# Patient Record
Sex: Female | Born: 1965 | Race: White | Hispanic: No | Marital: Married | Smoking: Former smoker
Health system: Southern US, Community
[De-identification: ages and names within clinical notes are randomized; demographics above are authoritative.]

## PROBLEM LIST (undated history)

## (undated) DIAGNOSIS — K219 Gastro-esophageal reflux disease without esophagitis: Secondary | ICD-10-CM

## (undated) DIAGNOSIS — M1990 Unspecified osteoarthritis, unspecified site: Secondary | ICD-10-CM

## (undated) DIAGNOSIS — I10 Essential (primary) hypertension: Secondary | ICD-10-CM

## (undated) HISTORY — PX: WISDOM TOOTH EXTRACTION: SHX21

## (undated) HISTORY — PX: KNEE ARTHROSCOPY: SUR90

## (undated) HISTORY — PX: COLONOSCOPY: SHX174

---

## 2012-05-18 HISTORY — PX: BACK SURGERY: SHX140

## 2013-04-10 ENCOUNTER — Other Ambulatory Visit: Payer: Self-pay | Admitting: Neurosurgery

## 2013-04-10 DIAGNOSIS — M549 Dorsalgia, unspecified: Secondary | ICD-10-CM

## 2013-04-14 ENCOUNTER — Ambulatory Visit
Admission: RE | Admit: 2013-04-14 | Discharge: 2013-04-14 | Disposition: A | Discharge status: Home / Self Care | Payer: BC Managed Care – PPO | Source: Ambulatory Visit | Attending: Neurosurgery | Admitting: Neurosurgery

## 2013-04-14 VITALS — BP 131/87 | HR 80

## 2013-04-14 DIAGNOSIS — M5126 Other intervertebral disc displacement, lumbar region: Secondary | ICD-10-CM

## 2013-04-14 DIAGNOSIS — M549 Dorsalgia, unspecified: Secondary | ICD-10-CM

## 2013-04-14 MED ORDER — METHYLPREDNISOLONE ACETATE 40 MG/ML INJ SUSP (RADIOLOG
120.0000 mg | Freq: Once | INTRAMUSCULAR | Status: AC
  Administered 2013-04-14: 120 mg via EPIDURAL

## 2013-04-14 MED ORDER — IOHEXOL 180 MG/ML  SOLN
1.0000 mL | Freq: Once | INTRAMUSCULAR | Status: AC | PRN
  Administered 2013-04-14: 1 mL via EPIDURAL

## 2018-09-07 ENCOUNTER — Encounter (HOSPITAL_COMMUNITY): Admission: RE | Payer: Self-pay | Source: Home / Self Care

## 2018-09-07 ENCOUNTER — Inpatient Hospital Stay (HOSPITAL_COMMUNITY): Admission: RE | Admit: 2018-09-07 | Payer: Self-pay | Source: Home / Self Care | Admitting: Orthopedic Surgery

## 2018-09-07 SURGERY — ARTHROPLASTY, KNEE, BILATERAL, TOTAL
Anesthesia: Choice | Laterality: Bilateral

## 2018-11-09 ENCOUNTER — Encounter (HOSPITAL_COMMUNITY): Payer: Self-pay | Admitting: Student

## 2018-11-09 NOTE — H&P (Signed)
TOTAL KNEE ADMISSION H&P  Patient is being admitted for bilateral total knee arthroplasty.  Subjective:  Chief Complaint:bilateral knee pain.  HPI: Joanna Barber, 53 y.o. female, has a history of pain and functional disability in the bilateral knee due to arthritis and has failed non-surgical conservative treatments for greater than 12 weeks to includecorticosteriod injections, viscosupplementation injections, weight reduction as appropriate and activity modification.  Onset of symptoms was gradual, starting >10 years ago with gradually worsening course since that time. The patient noted prior procedures on the knee to include  arthroscopy and menisectomy on the left knee(s), and previous arthroscopy and ACL reconstruction on the right knee.  Patient currently rates pain in the bilateral knee(s) at 8 out of 10 with activity. Patient has worsening of pain with activity and weight bearing, crepitus, joint swelling and instability.  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 She had a previous ACL reconstruction on the right. She has interference screws present in the tibia and femur by imaging studies. There is no active infection.  There are no active problems to display for this patient.  History reviewed. No pertinent past medical history.  History reviewed. No pertinent surgical history.  No current facility-administered medications for this encounter.    No current outpatient medications on file.   Allergies  Allergen Reactions  . Neosporin [Neomycin-Bacitracin Zn-Polymyx] Dermatitis    Eats up her skin   . Codeine Nausea And Vomiting    Social History   Tobacco Use  . Smoking status: Not on file  Substance Use Topics  . Alcohol use: Not on file    History reviewed. No pertinent family history.   Review of Systems  Constitutional: Negative for chills and fever.  HENT:  Negative for congestion, sore throat and tinnitus.   Eyes: Negative for double vision, photophobia and pain.  Respiratory: Negative for cough, shortness of breath and wheezing.   Cardiovascular: Negative for chest pain, palpitations and orthopnea.  Gastrointestinal: Negative for heartburn, nausea and vomiting.  Genitourinary: Negative for dysuria, frequency and urgency.  Musculoskeletal: Positive for joint pain.  Neurological: Negative for dizziness, weakness and headaches.    Objective:  Physical Exam  Well nourished and well developed.  General: Alert and oriented x3, cooperative and pleasant, no acute distress.  Head: normocephalic, atraumatic, neck supple.  Eyes: EOMI.  Respiratory: breath sounds clear in all fields, no wheezing, rales, or rhonchi. Cardiovascular: Regular rate and rhythm, no murmurs, gallops or rubs.  Abdomen: non-tender to palpation and soft, normoactive bowel sounds. Musculoskeletal:  Right Knee Exam:  Significant varus deformity. No effusion. No Swelling. Range of motion is 0-125 degrees.  No crepitus on range of motion of the knee.  Positive medial joint line tenderness No lateral joint line tenderness.  Significant A/P and varus/valgus laxity in the knee.   Left Knee Exam:  Significant varus deformity.  No effusion. No Swelling. Range of motion is 0-130 degrees.  No crepitus on range of motion of the knee.  Positive medial joint line tenderness No lateral joint line tenderness.  Valgus laxity is noted in terminal extension.  Significant A/P laxity in 90 degrees of flexion.   Calves soft and nontender. Motor function intact in LE. Strength 5/5 LE bilaterally. Neuro: Distal pulses 2+. Sensation to light touch intact in LE.  Vital signs in last 24 hours: Blood pressure: 162/80 mmHg Pulse: 84 bpm  Labs:   There is no  height or weight on file to calculate BMI.   Imaging Review Plain radiographs demonstrate severe degenerative joint disease  of the bilateral knee(s). The overall alignment isneutral. The bone quality appears to be adequate for age and reported activity level.      Assessment/Plan:  End stage arthritis, bilateral knees  The patient history, physical examination, clinical judgment of the provider and imaging studies are consistent with end stage degenerative joint disease of the bilateral knee(s) and total knee arthroplasty is deemed medically necessary. The treatment options including medical management, injection therapy arthroscopy and arthroplasty were discussed at length. The risks and benefits of total knee arthroplasty were presented and reviewed. The risks due to aseptic loosening, infection, stiffness, patella tracking problems, thromboembolic complications and other imponderables were discussed. The patient acknowledged the explanation, agreed to proceed with the plan and consent was signed. Patient is being admitted for inpatient treatment for surgery, pain control, PT, OT, prophylactic antibiotics, VTE prophylaxis, progressive ambulation and ADL's and discharge planning. The patient is planning to be discharged to inpatient rehab  Anticipated LOS equal to or greater than 2 midnights due to - Age 43 and older with one or more of the following:  - Obesity  - Expected need for hospital services (PT, OT, Nursing) required for safe  discharge  - Anticipated need for postoperative skilled nursing care or inpatient rehab  - Active co-morbidities: None OR   - Unanticipated findings during/Post Surgery: None  - Patient is a high risk of re-admission due to: None   Therapy Plans: [facility] then outpatient therapy at [facility] ([facility]) Disposition: Inpatient Rehab Planned DVT Prophylaxis: Xarelto 10 mg daily DME needed: Gilford Rile, 3-in-1 PCP: Doug Sou, DO TXA: IV Allergies: NKDA Anesthesia Concerns: None BMI: 33.7  - Patient was instructed on what medications to stop prior to surgery. -  Follow-up visit in 2 weeks with Dr. Wynelle Link - Begin physical therapy following surgery - Pre-operative lab work as pre-surgical testing - Prescriptions will be provided in hospital at time of discharge  Theresa Duty, PA-C Orthopedic Surgery [facility] [place]

## 2018-11-24 ENCOUNTER — Other Ambulatory Visit (HOSPITAL_COMMUNITY): Payer: Self-pay | Admitting: *Deleted

## 2018-11-24 NOTE — Patient Instructions (Addendum)
YOU HAD  A COVID 19 TEST ON 11-26-2018.  COME TO [place] ENTRANCE. ONCE YOUR COVID TEST IS COMPLETED, PLEASE BEGIN THE QUARANTINE INSTRUCTIONS AS OUTLINED IN YOUR HANDOUT.                Joanna Barber     Your procedure is scheduled on: 11-30-2018   Report to [place]  Entrance  Report to admitting at 7:55 AM      Call this number if you have problems the morning of surgery [phone]     Remember: [place], NO [place].   NO SOLID FOOD AFTER MIDNIGHT THE NIGHT PRIOR TO SURGERY.  NOTHING BY MOUTH EXCEPT CLEAR LIQUIDS UNTIL  7:25 AM.  PLEASE FINISH ENSURE DRINK PER SURGEON ORDER 3 HOURS PRIOR TO SCHEDULED SURGERY TIME WHICH NEEDS TO BE COMPLETED AT 7:25 AM.  CLEAR LIQUID DIET   Foods Allowed                                                                     Foods Excluded  Coffee and tea, regular and decaf                             liquids that you cannot  Plain Jell-O in any flavor                                             see through such as: Fruit ices (not with fruit pulp)                                     milk, soups, orange juice  Iced Popsicles                                    All solid food Carbonated beverages, regular and diet                                    Cranberry, grape and apple juices Sports drinks like Gatorade Lightly seasoned clear broth or consume(fat free) Sugar, honey syrup  Sample Menu Breakfast                                Lunch                                     Supper Cranberry juice                    Beef broth  Chicken broth Jell-O                                     Grape juice                           Apple juice Coffee or tea                        Jell-O                                      Popsicle                                                Coffee or tea                         Coffee or tea  _____________________________________________________________________      Take these medicines the morning of surgery with A SIP OF WATER: Prilosec                               You may not have any metal on your body including hair pins and              piercings              Do not wear jewelry, make-up, lotions, powders or perfumes, deodorant             Do not wear nail polish.  Do not shave  48 hours prior to surgery.               Do not bring valuables to the hospital.  IS NOT             RESPONSIBLE   FOR VALUABLES.  Contacts, dentures or bridgework may not be worn into surgery.     _____________________________________________________________________             [facility] Before surgery, you can play an important role .  Because skin is not sterile, your skin needs to be as free of germs as possible.   You can reduce the number of germs on your skin by washing with CHG (chlorahexidine gluconate) soap before surgery.   CHG is an antiseptic cleaner which kills germs and bonds with the skin to continue killing germs even after washing. Please DO NOT use if you have an allergy to CHG or antibacterial soaps.   If your skin becomes reddened/irritated stop using the CHG and inform your nurse when you arrive at Short Stay. Do not shave (including legs and underarms) for at least 48 hours prior to the first CHG shower.Please follow these instructions carefully:  1.  Shower with CHG Soap the night before surgery and the  morning of Surgery.  2.  If you choose to wash your hair, wash your hair first as usual with your  normal  shampoo.  3.  After you shampoo, rinse your hair and body thoroughly to remove the  shampoo.  4.  Use CHG as you would any other liquid soap.  You can apply chg directly  to the skin and wash                       Gently with a scrungie or clean washcloth.  5.  Apply  the CHG Soap to your body ONLY FROM THE NECK DOWN.   Do not use on face/ open                           Wound or open sores. Avoid contact with eyes, ears mouth and genitals (private parts).                       Wash face,  Genitals (private parts) with your normal soap.             6.  Wash thoroughly, paying special attention to the area where your surgery  will be performed.  7.  Thoroughly rinse your body with warm water from the neck down.  8.  DO NOT shower/wash with your normal soap after using and rinsing off  the CHG Soap.                9.  Pat yourself dry with a clean towel.            10.  Wear clean pajamas.            11.  Place clean sheets on your bed the night of your first shower and do not  sleep with pets. Day of Surgery : Do not apply any lotions/deodorants the morning of surgery.  Please wear clean clothes to the hospital/surgery center.  FAILURE TO FOLLOW THESE INSTRUCTIONS MAY RESULT IN THE CANCELLATION OF YOUR SURGERY PATIENT SIGNATURE_________________________________  NURSE SIGNATURE__________________________________  ________________________________________________________________________   Joanna Barber  An incentive spirometer is a tool that can help keep your lungs clear and active. This tool measures how well you are filling your lungs with each breath. Taking long deep breaths may help reverse or decrease the chance of developing breathing (pulmonary) problems (especially infection) following:  A long period of time when you are unable to move or be active. BEFORE THE PROCEDURE   If the spirometer includes an indicator to show your best effort, your nurse or respiratory therapist will set it to a desired goal.  If possible, sit up straight or lean slightly forward. Try not to slouch.  Hold the incentive spirometer in an upright position. INSTRUCTIONS FOR USE  1. Sit on the edge of your bed if possible, or sit up as far as you can in bed or on  a chair. 2. Hold the incentive spirometer in an upright position. 3. Breathe out normally. 4. Place the mouthpiece in your mouth and seal your lips tightly around it. 5. Breathe in slowly and as deeply as possible, raising the piston or the ball toward the top of the column. 6. Hold your breath for 3-5 seconds or for as long as possible. Allow the piston or ball to fall to the bottom of the column. 7. Remove the mouthpiece from your mouth and breathe out normally. 8. Rest for a few seconds and repeat Steps 1 through 7 at least 10 times every 1-2 hours when you are awake. Take your time and take a few normal breaths between deep breaths. 9. The spirometer may include an indicator to  show your best effort. Use the indicator as a goal to work toward during each repetition. 10. After each set of 10 deep breaths, practice coughing to be sure your lungs are clear. If you have an incision (the cut made at the time of surgery), support your incision when coughing by placing a pillow or rolled up towels firmly against it. Once you are able to get out of bed, walk around indoors and cough well. You may stop using the incentive spirometer when instructed by your caregiver.  RISKS AND COMPLICATIONS  Take your time so you do not get dizzy or light-headed.  If you are in pain, you may need to take or ask for pain medication before doing incentive spirometry. It is harder to take a deep breath if you are having pain. AFTER USE  Rest and breathe slowly and easily.  It can be helpful to keep track of a log of your progress. Your caregiver can provide you with a simple table to help with this. If you are using the spirometer at home, follow these instructions: [place] IF:   You are having difficultly using the spirometer.  You have trouble using the spirometer as often as instructed.  Your pain medication is not giving enough relief while using the spirometer.  You develop fever of 100.5 F  (38.1 C) or higher. SEEK IMMEDIATE MEDICAL CARE IF:   You cough up bloody sputum that had not been present before.  You develop fever of 102 F (38.9 C) or greater.  You develop worsening pain at or near the incision site. MAKE SURE YOU:   Understand these instructions.  Will watch your condition.  Will get help right away if you are not doing well or get worse. Document Released: 09/14/2006 Document Revised: 07/27/2011 Document Reviewed: 11/15/2006 ExitCare Patient Information 2014 ExitCare, [place].   ________________________________________________________________________  WHAT IS A BLOOD TRANSFUSION? Blood Transfusion Information  A transfusion is the replacement of blood or some of its parts. Blood is made up of multiple cells which provide different functions.  Red blood cells carry oxygen and are used for blood loss replacement.  White blood cells fight against infection.  Platelets control bleeding.  Plasma helps clot blood.  Other blood products are available for specialized needs, such as hemophilia or other clotting disorders. BEFORE THE TRANSFUSION  Who gives blood for transfusions?   Healthy volunteers who are fully evaluated to make sure their blood is safe. This is blood bank blood. Transfusion therapy is the safest it has ever been in the practice of medicine. Before blood is taken from a donor, a complete history is taken to make sure that person has no history of diseases nor engages in risky social behavior (examples are intravenous drug use or sexual activity with multiple partners). The donor's travel history is screened to minimize risk of transmitting infections, such as malaria. The donated blood is tested for signs of infectious diseases, such as HIV and hepatitis. The blood is then tested to be sure it is compatible with you in order to minimize the chance of a transfusion reaction. If you or a relative donates blood, this is often done in anticipation  of surgery and is not appropriate for emergency situations. It takes many days to process the donated blood. RISKS AND COMPLICATIONS Although transfusion therapy is very safe and saves many lives, the main dangers of transfusion include:   Getting an infectious disease.  Developing a transfusion reaction. This is an allergic reaction  to something in the blood you were given. Every precaution is taken to prevent this. The decision to have a blood transfusion has been considered carefully by your caregiver before blood is given. Blood is not given unless the benefits outweigh the risks. AFTER THE TRANSFUSION  Right after receiving a blood transfusion, you will usually feel much better and more energetic. This is especially true if your red blood cells have gotten low (anemic). The transfusion raises the level of the red blood cells which carry oxygen, and this usually causes an energy increase.  The nurse administering the transfusion will monitor you carefully for complications. HOME CARE INSTRUCTIONS  No special instructions are needed after a transfusion. You may find your energy is better. Speak with your caregiver about any limitations on activity for underlying diseases you may have. SEEK MEDICAL CARE IF:   Your condition is not improving after your transfusion.  You develop redness or irritation at the intravenous (IV) site. SEEK IMMEDIATE MEDICAL CARE IF:  Any of the following symptoms occur over the next 12 hours:  Shaking chills.  You have a temperature by mouth above 102 F (38.9 C), not controlled by medicine.  Chest, back, or muscle pain.  People around you feel you are not acting correctly or are confused.  Shortness of breath or difficulty breathing.  Dizziness and fainting.  You get a rash or develop hives.  You have a decrease in urine output.  Your urine turns a dark color or changes to pink, red, or brown. Any of the following symptoms occur over the next 10  days:  You have a temperature by mouth above 102 F (38.9 C), not controlled by medicine.  Shortness of breath.  Weakness after normal activity.  The white part of the eye turns yellow (jaundice).  You have a decrease in the amount of urine or are urinating less often.  Your urine turns a dark color or changes to pink, red, or brown. Document Released: 05/01/2000 Document Revised: 07/27/2011 Document Reviewed: 12/19/2007 [facility] Patient Information 2014 [place], [place].  _______________________________________________________________________[id number]

## 2018-11-26 ENCOUNTER — Other Ambulatory Visit (HOSPITAL_COMMUNITY)
Admission: RE | Admit: 2018-11-26 | Discharge: 2018-11-26 | Disposition: A | Discharge status: Home / Self Care | Payer: 59 | Source: Ambulatory Visit | Attending: Orthopedic Surgery | Admitting: Orthopedic Surgery

## 2018-11-26 DIAGNOSIS — Z1159 Encounter for screening for other viral diseases: Secondary | ICD-10-CM | POA: Diagnosis not present

## 2018-11-26 DIAGNOSIS — Z01812 Encounter for preprocedural laboratory examination: Secondary | ICD-10-CM | POA: Diagnosis not present

## 2018-11-26 LAB — SARS CORONAVIRUS 2 (TAT 6-24 HRS): SARS Coronavirus 2: NEGATIVE

## 2018-11-28 ENCOUNTER — Encounter (HOSPITAL_COMMUNITY): Payer: Self-pay

## 2018-11-28 ENCOUNTER — Other Ambulatory Visit: Payer: Self-pay

## 2018-11-28 ENCOUNTER — Encounter (HOSPITAL_COMMUNITY)
Admission: RE | Admit: 2018-11-28 | Discharge: 2018-11-28 | Disposition: A | Discharge status: Home / Self Care | Payer: 59 | Source: Ambulatory Visit | Attending: Orthopedic Surgery | Admitting: Orthopedic Surgery

## 2018-11-28 DIAGNOSIS — Z01818 Encounter for other preprocedural examination: Secondary | ICD-10-CM | POA: Insufficient documentation

## 2018-11-28 DIAGNOSIS — M170 Bilateral primary osteoarthritis of knee: Secondary | ICD-10-CM | POA: Insufficient documentation

## 2018-11-28 DIAGNOSIS — I10 Essential (primary) hypertension: Secondary | ICD-10-CM | POA: Insufficient documentation

## 2018-11-28 HISTORY — DX: Unspecified osteoarthritis, unspecified site: M19.90

## 2018-11-28 HISTORY — DX: Essential (primary) hypertension: I10

## 2018-11-28 LAB — COMPREHENSIVE METABOLIC PANEL
ALT: 39 U/L (ref 0–44)
AST: 33 U/L (ref 15–41)
Albumin: 4.3 g/dL (ref 3.5–5.0)
Alkaline Phosphatase: 84 U/L (ref 38–126)
Anion gap: 14 (ref 5–15)
BUN: 18 mg/dL (ref 6–20)
CO2: 25 mmol/L (ref 22–32)
Calcium: 9.6 mg/dL (ref 8.9–10.3)
Chloride: 100 mmol/L (ref 98–111)
Creatinine, Ser: 0.65 mg/dL (ref 0.44–1.00)
GFR calc Af Amer: >60 mL/min (ref >60)
GFR calc non Af Amer: >60 mL/min (ref >60)
Glucose, Bld: 114 mg/dL — ABNORMAL HIGH (ref 70–99)
Potassium: 3.9 mmol/L (ref 3.5–5.1)
Sodium: 139 mmol/L (ref 135–145)
Total Bilirubin: 0.6 mg/dL (ref 0.3–1.2)
Total Protein: 7.8 g/dL (ref 6.5–8.1)

## 2018-11-28 LAB — SURGICAL PCR SCREEN
MRSA, PCR: NEGATIVE
Staphylococcus aureus: NEGATIVE

## 2018-11-28 LAB — PROTIME-INR
INR: 1 (ref 0.8–1.2)
Prothrombin Time: 12.9 seconds (ref 11.4–15.2)

## 2018-11-28 LAB — CBC
HCT: 38.5 % (ref 36.0–46.0)
Hemoglobin: 12.4 g/dL (ref 12.0–15.0)
MCH: 32.5 pg (ref 26.0–34.0)
MCHC: 32.2 g/dL (ref 30.0–36.0)
MCV: 100.8 fL — ABNORMAL HIGH (ref 80.0–100.0)
Platelets: 264 10*3/uL (ref 150–400)
RBC: 3.82 MIL/uL — ABNORMAL LOW (ref 3.87–5.11)
RDW: 12.5 % (ref 11.5–15.5)
WBC: 6.5 10*3/uL (ref 4.0–10.5)
nRBC: 0 % (ref 0.0–0.2)

## 2018-11-28 LAB — APTT: aPTT: 31 seconds (ref 24–36)

## 2018-11-28 LAB — ABO/RH: ABO/RH(D): O POS

## 2018-11-29 NOTE — Progress Notes (Signed)
Spoke with patient by phone, patient left ensure and hibiclens on shuttle yesterday and does not have those items . Patient to be npo after midnight clear liquids until 725 am then npo, will not do ensure drink and will go to pharmacy and get hibiclens, will take prilosec am of surgery ariive 755am 11-30-18 [facility] admitting.

## 2018-11-29 NOTE — Anesthesia Preprocedure Evaluation (Addendum)
Anesthesia Evaluation  Patient identified by MRN, date of birth, ID band Patient awake    Reviewed: Allergy & Precautions, H&P , NPO status , Patient's Chart, lab work & pertinent test results  Airway Mallampati: II  TM Distance: >3 FB Neck ROM: Full    Dental no notable dental hx. (+) Teeth Intact, Dental Advisory Given   Pulmonary neg pulmonary ROS, former smoker,    Pulmonary exam normal breath sounds clear to auscultation       Cardiovascular Exercise Tolerance: Good hypertension, Pt. on medications  Rhythm:Regular Rate:Normal     Neuro/Psych negative neurological ROS  negative psych ROS   GI/Hepatic negative GI ROS, Neg liver ROS,   Endo/Other  negative endocrine ROS  Renal/GU negative Renal ROS  negative genitourinary   Musculoskeletal  (+) Arthritis , Osteoarthritis,    Abdominal   Peds  Hematology negative hematology ROS (+)   Anesthesia Other Findings   Reproductive/Obstetrics negative OB ROS                            Anesthesia Physical Anesthesia Plan  ASA: II  Anesthesia Plan: Epidural and MAC   Post-op Pain Management:    Induction: Intravenous  PONV Risk Score and Plan: 2 and Propofol infusion, Ondansetron and Midazolam  Airway Management Planned: Simple Face Mask  Additional Equipment:   Intra-op Plan:   Post-operative Plan:   Informed Consent: I have reviewed the patients History and Physical, chart, labs and discussed the procedure including the risks, benefits and alternatives for the proposed anesthesia with the patient or authorized representative who has indicated his/her understanding and acceptance.     Dental advisory given  Plan Discussed with: CRNA  Anesthesia Plan Comments:         Anesthesia Quick Evaluation

## 2018-11-30 ENCOUNTER — Other Ambulatory Visit: Payer: Self-pay

## 2018-11-30 ENCOUNTER — Inpatient Hospital Stay (HOSPITAL_COMMUNITY): Payer: 59 | Admitting: Emergency Medicine

## 2018-11-30 ENCOUNTER — Inpatient Hospital Stay (HOSPITAL_COMMUNITY): Payer: 59 | Admitting: Certified Registered"

## 2018-11-30 ENCOUNTER — Encounter (HOSPITAL_COMMUNITY): Payer: Self-pay

## 2018-11-30 ENCOUNTER — Encounter (HOSPITAL_COMMUNITY)
Admission: RE | Disposition: A | Discharge status: Home / Self Care | Payer: Self-pay | Source: Home / Self Care | Attending: Orthopedic Surgery

## 2018-11-30 ENCOUNTER — Inpatient Hospital Stay (HOSPITAL_COMMUNITY)
Admission: RE | Admit: 2018-11-30 | Discharge: 2018-12-09 | DRG: 462 | Disposition: A | Discharge status: Home Health | Payer: 59 | Attending: Orthopedic Surgery | Admitting: Orthopedic Surgery

## 2018-11-30 DIAGNOSIS — M170 Bilateral primary osteoarthritis of knee: Secondary | ICD-10-CM | POA: Diagnosis present

## 2018-11-30 DIAGNOSIS — Z79899 Other long term (current) drug therapy: Secondary | ICD-10-CM

## 2018-11-30 DIAGNOSIS — E669 Obesity, unspecified: Secondary | ICD-10-CM | POA: Diagnosis present

## 2018-11-30 DIAGNOSIS — Z87891 Personal history of nicotine dependence: Secondary | ICD-10-CM | POA: Diagnosis not present

## 2018-11-30 DIAGNOSIS — Z1159 Encounter for screening for other viral diseases: Secondary | ICD-10-CM | POA: Diagnosis not present

## 2018-11-30 DIAGNOSIS — I10 Essential (primary) hypertension: Secondary | ICD-10-CM | POA: Diagnosis present

## 2018-11-30 DIAGNOSIS — M1710 Unilateral primary osteoarthritis, unspecified knee: Secondary | ICD-10-CM

## 2018-11-30 DIAGNOSIS — Z6833 Body mass index (BMI) 33.0-33.9, adult: Secondary | ICD-10-CM | POA: Diagnosis not present

## 2018-11-30 DIAGNOSIS — M179 Osteoarthritis of knee, unspecified: Secondary | ICD-10-CM | POA: Diagnosis present

## 2018-11-30 HISTORY — PX: TOTAL KNEE ARTHROPLASTY: SHX125

## 2018-11-30 LAB — TYPE AND SCREEN
ABO/RH(D): O POS
Antibody Screen: NEGATIVE

## 2018-11-30 SURGERY — ARTHROPLASTY, KNEE, BILATERAL, TOTAL
Anesthesia: Monitor Anesthesia Care | Site: Knee | Laterality: Bilateral

## 2018-11-30 MED ORDER — PROPOFOL 10 MG/ML IV BOLUS
INTRAVENOUS | Status: AC
  Filled 2018-11-30: qty 20

## 2018-11-30 MED ORDER — METOCLOPRAMIDE HCL 5 MG/ML IJ SOLN: 5.0000 mg | Freq: Three times a day (TID) | INTRAMUSCULAR | Status: DC | PRN

## 2018-11-30 MED ORDER — FLEET ENEMA 7-19 GM/118ML RE ENEM: 1.0000 | ENEMA | Freq: Once | RECTAL | Status: DC | PRN

## 2018-11-30 MED ORDER — CHLORHEXIDINE GLUCONATE 4 % EX LIQD: 60.0000 mL | Freq: Once | CUTANEOUS | Status: DC

## 2018-11-30 MED ORDER — PHENOL 1.4 % MT LIQD: 1.0000 | OROMUCOSAL | Status: DC | PRN

## 2018-11-30 MED ORDER — SODIUM CHLORIDE 0.9 % IR SOLN
Status: DC | PRN
  Administered 2018-11-30: 3000 mL

## 2018-11-30 MED ORDER — ACETAMINOPHEN 10 MG/ML IV SOLN
1000.0000 mg | Freq: Four times a day (QID) | INTRAVENOUS | Status: DC
  Administered 2018-11-30: 1000 mg via INTRAVENOUS
  Filled 2018-11-30: qty 100

## 2018-11-30 MED ORDER — CEFAZOLIN SODIUM-DEXTROSE 2-4 GM/100ML-% IV SOLN
2.0000 g | INTRAVENOUS | Status: AC
  Administered 2018-11-30: 2 g via INTRAVENOUS
  Filled 2018-11-30: qty 100

## 2018-11-30 MED ORDER — PROPOFOL 10 MG/ML IV BOLUS
INTRAVENOUS | Status: DC | PRN
  Administered 2018-11-30: 20 mg via INTRAVENOUS

## 2018-11-30 MED ORDER — EPHEDRINE 5 MG/ML INJ
INTRAVENOUS | Status: AC
  Filled 2018-11-30: qty 10

## 2018-11-30 MED ORDER — METHOCARBAMOL 1000 MG/10ML IJ SOLN
500.0000 mg | Freq: Four times a day (QID) | INTRAVENOUS | Status: DC | PRN
  Filled 2018-11-30: qty 5

## 2018-11-30 MED ORDER — ACETAMINOPHEN 500 MG PO TABS
1000.0000 mg | ORAL_TABLET | Freq: Four times a day (QID) | ORAL | Status: AC
  Administered 2018-11-30 – 2018-12-01 (×4): 1000 mg via ORAL
  Filled 2018-11-30 (×3): qty 2

## 2018-11-30 MED ORDER — MENTHOL 3 MG MT LOZG: 1.0000 | LOZENGE | OROMUCOSAL | Status: DC | PRN

## 2018-11-30 MED ORDER — PHENYLEPHRINE 40 MCG/ML (10ML) SYRINGE FOR IV PUSH (FOR BLOOD PRESSURE SUPPORT)
PREFILLED_SYRINGE | INTRAVENOUS | Status: AC
  Filled 2018-11-30: qty 10

## 2018-11-30 MED ORDER — PHENYLEPHRINE 40 MCG/ML (10ML) SYRINGE FOR IV PUSH (FOR BLOOD PRESSURE SUPPORT)
PREFILLED_SYRINGE | INTRAVENOUS | Status: DC | PRN
  Administered 2018-11-30 (×2): 80 ug via INTRAVENOUS
  Administered 2018-11-30: 40 ug via INTRAVENOUS
  Administered 2018-11-30: 80 ug via INTRAVENOUS
  Administered 2018-11-30: 40 ug via INTRAVENOUS
  Administered 2018-11-30: 80 ug via INTRAVENOUS

## 2018-11-30 MED ORDER — ASPIRIN EC 325 MG PO TBEC
325.0000 mg | DELAYED_RELEASE_TABLET | Freq: Two times a day (BID) | ORAL | Status: AC
  Administered 2018-12-01 – 2018-12-02 (×4): 325 mg via ORAL
  Filled 2018-11-30 (×4): qty 1

## 2018-11-30 MED ORDER — PROPOFOL 10 MG/ML IV BOLUS
INTRAVENOUS | Status: AC
  Filled 2018-11-30: qty 80

## 2018-11-30 MED ORDER — BUPIVACAINE HCL (PF) 0.5 % IJ SOLN
INTRAMUSCULAR | Status: AC
  Filled 2018-11-30: qty 60

## 2018-11-30 MED ORDER — MIDAZOLAM HCL 2 MG/2ML IJ SOLN
INTRAMUSCULAR | Status: DC | PRN
  Administered 2018-11-30: 2 mg via INTRAVENOUS

## 2018-11-30 MED ORDER — TRANEXAMIC ACID-NACL 1000-0.7 MG/100ML-% IV SOLN
1000.0000 mg | INTRAVENOUS | Status: AC
  Administered 2018-11-30: 1000 mg via INTRAVENOUS
  Filled 2018-11-30: qty 100

## 2018-11-30 MED ORDER — BISACODYL 10 MG RE SUPP
10.0000 mg | Freq: Every day | RECTAL | Status: DC | PRN
  Administered 2018-12-05: 10 mg via RECTAL
  Filled 2018-11-30: qty 1

## 2018-11-30 MED ORDER — METHOCARBAMOL 500 MG PO TABS
500.0000 mg | ORAL_TABLET | Freq: Four times a day (QID) | ORAL | Status: DC | PRN
  Administered 2018-12-01 – 2018-12-09 (×15): 500 mg via ORAL
  Filled 2018-11-30 (×15): qty 1

## 2018-11-30 MED ORDER — DEXAMETHASONE SODIUM PHOSPHATE 10 MG/ML IJ SOLN
10.0000 mg | Freq: Once | INTRAMUSCULAR | Status: AC
  Administered 2018-12-01: 10 mg via INTRAVENOUS
  Filled 2018-11-30: qty 1

## 2018-11-30 MED ORDER — PROPOFOL 10 MG/ML IV BOLUS
INTRAVENOUS | Status: AC
  Filled 2018-11-30: qty 40

## 2018-11-30 MED ORDER — DEXAMETHASONE SODIUM PHOSPHATE 10 MG/ML IJ SOLN
8.0000 mg | Freq: Once | INTRAMUSCULAR | Status: AC
  Administered 2018-11-30: 8 mg via INTRAVENOUS

## 2018-11-30 MED ORDER — STERILE WATER FOR IRRIGATION IR SOLN
Status: DC | PRN
  Administered 2018-11-30: 2000 mL

## 2018-11-30 MED ORDER — FENTANYL CITRATE (PF) 100 MCG/2ML IJ SOLN
INTRAMUSCULAR | Status: AC
  Filled 2018-11-30: qty 2

## 2018-11-30 MED ORDER — GABAPENTIN 300 MG PO CAPS
300.0000 mg | ORAL_CAPSULE | Freq: Three times a day (TID) | ORAL | Status: DC
  Administered 2018-11-30 – 2018-12-09 (×26): 300 mg via ORAL
  Filled 2018-11-30 (×27): qty 1

## 2018-11-30 MED ORDER — 0.9 % SODIUM CHLORIDE (POUR BTL) OPTIME
TOPICAL | Status: DC | PRN
  Administered 2018-11-30: 1000 mL

## 2018-11-30 MED ORDER — TRANEXAMIC ACID-NACL 1000-0.7 MG/100ML-% IV SOLN
1000.0000 mg | Freq: Once | INTRAVENOUS | Status: AC
  Administered 2018-11-30: 1000 mg via INTRAVENOUS
  Filled 2018-11-30: qty 100

## 2018-11-30 MED ORDER — ONDANSETRON HCL 4 MG/2ML IJ SOLN: 4.0000 mg | Freq: Four times a day (QID) | INTRAMUSCULAR | Status: DC | PRN

## 2018-11-30 MED ORDER — CEFAZOLIN SODIUM-DEXTROSE 2-4 GM/100ML-% IV SOLN
2.0000 g | Freq: Four times a day (QID) | INTRAVENOUS | Status: AC
  Administered 2018-11-30 (×2): 2 g via INTRAVENOUS
  Filled 2018-11-30 (×2): qty 100

## 2018-11-30 MED ORDER — HYDROMORPHONE HCL 2 MG PO TABS
2.0000 mg | ORAL_TABLET | ORAL | Status: DC | PRN
  Administered 2018-12-01 – 2018-12-02 (×6): 2 mg via ORAL
  Filled 2018-11-30 (×5): qty 1

## 2018-11-30 MED ORDER — SODIUM CHLORIDE 0.9 % IV SOLN
INTRAVENOUS | Status: DC
  Administered 2018-11-30: 17:00:00 via INTRAVENOUS

## 2018-11-30 MED ORDER — POLYETHYLENE GLYCOL 3350 17 G PO PACK
17.0000 g | PACK | Freq: Every day | ORAL | Status: DC | PRN
  Administered 2018-12-03 – 2018-12-04 (×2): 17 g via ORAL
  Filled 2018-11-30 (×2): qty 1

## 2018-11-30 MED ORDER — HYDROMORPHONE HCL 1 MG/ML IJ SOLN
0.5000 mg | INTRAMUSCULAR | Status: DC | PRN
  Administered 2018-12-02: 0.5 mg via INTRAVENOUS
  Filled 2018-11-30: qty 0.5

## 2018-11-30 MED ORDER — SODIUM CHLORIDE 0.9 % IV SOLN
INTRAVENOUS | Status: DC | PRN
  Administered 2018-11-30: 15 ug/min via INTRAVENOUS

## 2018-11-30 MED ORDER — LIDOCAINE 2% (20 MG/ML) 5 ML SYRINGE: INTRAMUSCULAR | Status: DC | PRN

## 2018-11-30 MED ORDER — ONDANSETRON HCL 4 MG/2ML IJ SOLN
INTRAMUSCULAR | Status: AC
  Filled 2018-11-30: qty 2

## 2018-11-30 MED ORDER — ONDANSETRON HCL 4 MG/2ML IJ SOLN
INTRAMUSCULAR | Status: DC | PRN
  Administered 2018-11-30: 4 mg via INTRAVENOUS

## 2018-11-30 MED ORDER — MIDAZOLAM HCL 2 MG/2ML IJ SOLN
INTRAMUSCULAR | Status: AC
  Filled 2018-11-30: qty 2

## 2018-11-30 MED ORDER — DIPHENHYDRAMINE HCL 12.5 MG/5ML PO ELIX: 12.5000 mg | ORAL_SOLUTION | ORAL | Status: DC | PRN

## 2018-11-30 MED ORDER — FENTANYL CITRATE (PF) 100 MCG/2ML IJ SOLN
INTRAMUSCULAR | Status: DC | PRN
  Administered 2018-11-30 (×2): 50 ug via INTRAVENOUS

## 2018-11-30 MED ORDER — HYDROMORPHONE HCL 2 MG PO TABS
4.0000 mg | ORAL_TABLET | ORAL | Status: DC | PRN
  Administered 2018-12-02 (×3): 4 mg via ORAL
  Filled 2018-11-30 (×4): qty 2

## 2018-11-30 MED ORDER — BUPIVACAINE HCL (PF) 0.5 % IJ SOLN
INTRAMUSCULAR | Status: DC | PRN
  Administered 2018-11-30: 5 mL via INTRATHECAL

## 2018-11-30 MED ORDER — LIDOCAINE 2% (20 MG/ML) 5 ML SYRINGE
INTRAMUSCULAR | Status: AC
  Filled 2018-11-30: qty 5

## 2018-11-30 MED ORDER — PHENYLEPHRINE HCL (PRESSORS) 10 MG/ML IV SOLN
INTRAVENOUS | Status: AC
  Filled 2018-11-30: qty 1

## 2018-11-30 MED ORDER — DEXAMETHASONE SODIUM PHOSPHATE 10 MG/ML IJ SOLN
INTRAMUSCULAR | Status: AC
  Filled 2018-11-30: qty 1

## 2018-11-30 MED ORDER — HYDROMORPHONE HCL 1 MG/ML IJ SOLN: 0.2500 mg | INTRAMUSCULAR | Status: DC | PRN

## 2018-11-30 MED ORDER — ONDANSETRON HCL 4 MG PO TABS: 4.0000 mg | ORAL_TABLET | Freq: Four times a day (QID) | ORAL | Status: DC | PRN

## 2018-11-30 MED ORDER — PROPOFOL 500 MG/50ML IV EMUL
INTRAVENOUS | Status: DC | PRN
  Administered 2018-11-30: 25 ug/kg/min via INTRAVENOUS

## 2018-11-30 MED ORDER — ROPIVACAINE HCL 2 MG/ML IJ SOLN
10.0000 mL/h | INTRAMUSCULAR | Status: DC
  Administered 2018-11-30 – 2018-12-02 (×3): 10 mL/h via EPIDURAL
  Filled 2018-11-30 (×7): qty 200

## 2018-11-30 MED ORDER — METOCLOPRAMIDE HCL 5 MG PO TABS: 5.0000 mg | ORAL_TABLET | Freq: Three times a day (TID) | ORAL | Status: DC | PRN

## 2018-11-30 MED ORDER — LACTATED RINGERS IV SOLN
INTRAVENOUS | Status: DC
  Administered 2018-11-30: 09:00:00 via INTRAVENOUS

## 2018-11-30 MED ORDER — EPHEDRINE SULFATE-NACL 50-0.9 MG/10ML-% IV SOSY
PREFILLED_SYRINGE | INTRAVENOUS | Status: DC | PRN
  Administered 2018-11-30: 5 mg via INTRAVENOUS

## 2018-11-30 MED ORDER — DOCUSATE SODIUM 100 MG PO CAPS
100.0000 mg | ORAL_CAPSULE | Freq: Two times a day (BID) | ORAL | Status: DC
  Administered 2018-11-30 – 2018-12-09 (×18): 100 mg via ORAL
  Filled 2018-11-30 (×18): qty 1

## 2018-11-30 MED ORDER — PANTOPRAZOLE SODIUM 40 MG PO TBEC
40.0000 mg | DELAYED_RELEASE_TABLET | Freq: Every day | ORAL | Status: DC
  Administered 2018-12-02 – 2018-12-09 (×8): 40 mg via ORAL
  Filled 2018-11-30 (×9): qty 1

## 2018-11-30 MED ORDER — LIDOCAINE-EPINEPHRINE (PF) 2 %-1:200000 IJ SOLN
INTRAMUSCULAR | Status: DC | PRN
  Administered 2018-11-30: 5 mL via EPIDURAL
  Administered 2018-11-30: 3 mL via EPIDURAL
  Administered 2018-11-30: 2 mL via EPIDURAL

## 2018-11-30 SURGICAL SUPPLY — 65 items
ATTUNE PSFEM LTSZ5 NARCEM KNEE (Femur) ×3 IMPLANT
ATTUNE PSFEM RTSZ5 NARCEM KNEE (Femur) ×3 IMPLANT
ATTUNE PSRP INSR SZ 5 10M KNEE (Insert) ×6 IMPLANT
BAG ZIPLOCK 12X15 (MISCELLANEOUS) ×6 IMPLANT
BANDAGE ACE 6X5 VEL STRL LF (GAUZE/BANDAGES/DRESSINGS) ×6 IMPLANT
BANDAGE ESMARK 6X9 LF (GAUZE/BANDAGES/DRESSINGS) ×1 IMPLANT
BASEPLATE TIBIAL ROTATING SZ 4 (Knees) ×6 IMPLANT
BLADE SAG 18X100X1.27 (BLADE) ×6 IMPLANT
BLADE SAW SGTL 11.0X1.19X90.0M (BLADE) ×6 IMPLANT
BLADE SURG SZ10 CARB STEEL (BLADE) ×12 IMPLANT
BNDG COHESIVE 6X5 TAN STRL LF (GAUZE/BANDAGES/DRESSINGS) ×3 IMPLANT
BNDG ESMARK 6X9 LF (GAUZE/BANDAGES/DRESSINGS) ×3
BOWL SMART MIX CTS (DISPOSABLE) ×6 IMPLANT
CEMENT HV SMART SET (Cement) ×12 IMPLANT
CLOSURE WOUND 1/2 X4 (GAUZE/BANDAGES/DRESSINGS) ×1
COVER SURGICAL LIGHT HANDLE (MISCELLANEOUS) ×3 IMPLANT
COVER WAND RF STERILE (DRAPES) IMPLANT
CUFF TOURN SGL QUICK 34 (TOURNIQUET CUFF) ×4
CUFF TRNQT CYL 34X4.125X (TOURNIQUET CUFF) ×2 IMPLANT
DRAPE EXTREMITY BILATERAL (DRAPES) ×3 IMPLANT
DRAPE INCISE IOBAN 66X45 STRL (DRAPES) ×3 IMPLANT
DRAPE U-SHAPE 47X51 STRL (DRAPES) ×9 IMPLANT
DRSG ADAPTIC 3X8 NADH LF (GAUZE/BANDAGES/DRESSINGS) ×3 IMPLANT
DRSG PAD ABDOMINAL 8X10 ST (GAUZE/BANDAGES/DRESSINGS) ×12 IMPLANT
DURAPREP 26ML APPLICATOR (WOUND CARE) ×6 IMPLANT
ELECT REM PT RETURN 15FT ADLT (MISCELLANEOUS) ×3 IMPLANT
EVACUATOR 1/8 PVC DRAIN (DRAIN) ×6 IMPLANT
FACESHIELD WRAPAROUND (MASK) ×15 IMPLANT
GAUZE SPONGE 4X4 12PLY STRL (GAUZE/BANDAGES/DRESSINGS) ×6 IMPLANT
GLOVE BIO SURGEON STRL SZ 6.5 (GLOVE) ×4 IMPLANT
GLOVE BIO SURGEON STRL SZ8 (GLOVE) ×6 IMPLANT
GLOVE BIO SURGEONS STRL SZ 6.5 (GLOVE) ×2
GLOVE BIOGEL PI IND STRL 7.0 (GLOVE) ×1 IMPLANT
GLOVE BIOGEL PI IND STRL 8 (GLOVE) ×1 IMPLANT
GLOVE BIOGEL PI INDICATOR 7.0 (GLOVE) ×2
GLOVE BIOGEL PI INDICATOR 8 (GLOVE) ×2
GOWN STRL REUS W/TWL LRG LVL3 (GOWN DISPOSABLE) ×6 IMPLANT
HANDPIECE INTERPULSE COAX TIP (DISPOSABLE) ×2
IMMOBILIZER KNEE 20 (SOFTGOODS) ×6
IMMOBILIZER KNEE 20 THIGH 36 (SOFTGOODS) ×2 IMPLANT
KIT TURNOVER KIT A (KITS) ×3 IMPLANT
MANIFOLD NEPTUNE II (INSTRUMENTS) ×3 IMPLANT
NS IRRIG 1000ML POUR BTL (IV SOLUTION) ×3 IMPLANT
PACK TOTAL KNEE CUSTOM (KITS) ×3 IMPLANT
PADDING CAST COTTON 6X4 STRL (CAST SUPPLIES) ×6 IMPLANT
PATELLA MEDIAL ATTUN 35MM KNEE (Knees) ×6 IMPLANT
PIN STEINMAN FIXATION KNEE (PIN) ×3 IMPLANT
PIN THREADED HEADED SIGMA (PIN) ×3 IMPLANT
SET HNDPC FAN SPRY TIP SCT (DISPOSABLE) ×1 IMPLANT
SPONGE LAP 18X18 RF (DISPOSABLE) ×3 IMPLANT
STOCKINETTE 8 INCH (MISCELLANEOUS) ×3 IMPLANT
STRIP CLOSURE SKIN 1/2X4 (GAUZE/BANDAGES/DRESSINGS) ×2 IMPLANT
SUCTION FRAZIER HANDLE 12FR (TUBING) ×2
SUCTION TUBE FRAZIER 12FR DISP (TUBING) ×1 IMPLANT
SUT ETHIBOND NAB CT1 #1 30IN (SUTURE) ×3 IMPLANT
SUT MNCRL AB 4-0 PS2 18 (SUTURE) ×6 IMPLANT
SUT STRATAFIX 0 PDS 27 VIOLET (SUTURE) ×3
SUT VIC AB 2-0 CT1 27 (SUTURE) ×12
SUT VIC AB 2-0 CT1 TAPERPNT 27 (SUTURE) ×6 IMPLANT
SUTURE STRATFX 0 PDS 27 VIOLET (SUTURE) ×1 IMPLANT
SYR 50ML LL SCALE MARK (SYRINGE) IMPLANT
TRAY FOLEY MTR SLVR 14FR STAT (SET/KITS/TRAYS/PACK) ×3 IMPLANT
WATER STERILE IRR 1000ML POUR (IV SOLUTION) ×6 IMPLANT
WRAP KNEE MAXI GEL POST OP (GAUZE/BANDAGES/DRESSINGS) ×6 IMPLANT
YANKAUER SUCT BULB TIP 10FT TU (MISCELLANEOUS) ×3 IMPLANT

## 2018-11-30 NOTE — Transfer of Care (Signed)
Immediate Anesthesia Transfer of Care Note  Patient: Joanna Barber  Procedure(s) Performed: TOTAL KNEE BILATERAL (Bilateral Knee)  Patient Location: PACU  Anesthesia Type:Epidural  Level of Consciousness: awake, alert  and oriented  Airway & Oxygen Therapy: Patient Spontanous Breathing and Patient connected to face mask oxygen  Post-op Assessment: Report given to RN and Post -op Vital signs reviewed and stable  Post vital signs: Reviewed and stable  Last Vitals:  Vitals Value Taken Time  BP 130/86 11/30/18 1247  Temp    Pulse 91 11/30/18 1251  Resp 22 11/30/18 1251  SpO2 100 % 11/30/18 1251  Vitals shown include unvalidated device data.  Last Pain:  Vitals:   11/30/18 0820  TempSrc:   PainSc: 0-No pain         Complications: No apparent anesthesia complications

## 2018-11-30 NOTE — Anesthesia Postprocedure Evaluation (Signed)
Anesthesia Post Note  Patient: Joanna Barber  Procedure(s) Performed: TOTAL KNEE BILATERAL (Bilateral Knee)     Patient location during evaluation: PACU Anesthesia Type: MAC and Epidural Level of consciousness: awake and alert Pain management: pain level controlled Vital Signs Assessment: post-procedure vital signs reviewed and stable Respiratory status: spontaneous breathing, nonlabored ventilation, respiratory function stable and patient connected to nasal cannula oxygen Cardiovascular status: stable and blood pressure returned to baseline Postop Assessment: no apparent nausea or vomiting, no headache and no backache Anesthetic complications: no    Last Vitals:  Vitals:   11/30/18 1345 11/30/18 1400  BP: 94/65 (!) 94/55  Pulse: 71 74  Resp: 16 16  Temp: 36.7 C   SpO2: 97% 98%    Last Pain:  Vitals:   11/30/18 1400  TempSrc:   PainSc: 0-No pain                 Madaleine Simmon,W. EDMOND

## 2018-11-30 NOTE — Discharge Instructions (Addendum)
 Dr. Frank Aluisio Total Joint Specialist [facility] [address] [place], [place] [address] [phone]  TOTAL KNEE REPLACEMENT POSTOPERATIVE DIRECTIONS  Knee Rehabilitation, Guidelines Following Surgery  Results after knee surgery are often greatly improved when you follow the exercise, range of motion and muscle strengthening exercises prescribed by your doctor. Safety measures are also important to protect the knee from further injury. Any time any of these exercises cause you to have increased pain or swelling in your knee joint, decrease the amount until you are comfortable again and slowly increase them. If you have problems or questions, call your caregiver or physical therapist for advice.   HOME CARE INSTRUCTIONS  . Remove items at home which could result in a fall. This includes throw rugs or furniture in walking pathways.   ICE to the affected knee every three hours for 30 minutes at a time and then as needed for pain and swelling.  Continue to use ice on the knee for pain and swelling from surgery. You may notice swelling that will progress down to the foot and ankle.  This is normal after surgery.  Elevate the leg when you are not up walking on it.    Continue to use the breathing machine which will help keep your temperature down.  It is common for your temperature to cycle up and down following surgery, especially at night when you are not up moving around and exerting yourself.  The breathing machine keeps your lungs expanded and your temperature down.  Do not place pillow under knee, focus on keeping the knee straight while resting  DIET You may resume your previous home diet once your are discharged from the hospital.  DRESSING / WOUND CARE / SHOWERING You may change your dressing 3-5 days after surgery.  Then change the dressing every day with sterile gauze.  Please use good hand washing techniques before changing the dressing.  Do not use any lotions  or creams on the incision until instructed by your surgeon. You may start showering once you are discharged home but do not submerge the incision under water. Just pat the incision dry and apply a dry gauze dressing on daily. Change the surgical dressing daily and reapply a dry dressing each time.  ACTIVITY Walk with your walker as instructed. Use walker as long as suggested by your caregivers. Avoid periods of inactivity such as sitting longer than an hour when not asleep. This helps prevent blood clots.  You may resume a sexual relationship in one month or when given the OK by your doctor.  You may return to work once you are cleared by your doctor.  Do not drive a car for 6 weeks or until released by you surgeon.  Do not drive while taking narcotics.  WEIGHT BEARING Weight bearing as tolerated with assist device (walker, cane, etc) as directed, use it as long as suggested by your surgeon or therapist, typically at least 4-6 weeks.  POSTOPERATIVE CONSTIPATION PROTOCOL Constipation - defined medically as fewer than three stools per week and severe constipation as less than one stool per week.  One of the most common issues patients have following surgery is constipation.  Even if you have a regular bowel pattern at home, your normal regimen is likely to be disrupted due to multiple reasons following surgery.  Combination of anesthesia, postoperative narcotics, change in appetite and fluid intake all can affect your bowels.  In order to avoid complications following surgery, here are some   recommendations in order to help you during your recovery period.  Colace (docusate) - Pick up an over-the-counter form of Colace or another stool softener and take twice a day as long as you are requiring postoperative pain medications.  Take with a full glass of water daily.  If you experience loose stools or diarrhea, hold the colace until you stool forms back up.  If your symptoms do not get better within 1  week or if they get worse, check with your doctor.  Dulcolax (bisacodyl) - Pick up over-the-counter and take as directed by the product packaging as needed to assist with the movement of your bowels.  Take with a full glass of water.  Use this product as needed if not relieved by Colace only.   MiraLax (polyethylene glycol) - Pick up over-the-counter to have on hand.  MiraLax is a solution that will increase the amount of water in your bowels to assist with bowel movements.  Take as directed and can mix with a glass of water, juice, soda, coffee, or tea.  Take if you go more than two days without a movement. Do not use MiraLax more than once per day. Call your doctor if you are still constipated or irregular after using this medication for 7 days in a row.  If you continue to have problems with postoperative constipation, please contact the office for further assistance and recommendations.  If you experience "the worst abdominal pain ever" or develop nausea or vomiting, please contact the office immediatly for further recommendations for treatment.  ITCHING If you experience itching with your medications, try taking only a single pain pill, or even half a pain pill at a time.  You can also use Benadryl over the counter for itching or also to help with sleep.   TED HOSE STOCKINGS Wear the elastic stockings on both legs for three weeks following surgery during the day but you may remove then at night for sleeping.  MEDICATIONS See your medication summary on the "After Visit Summary" that the nursing staff will review with you prior to discharge.  You may have some home medications which will be placed on hold until you complete the course of blood thinner medication.  It is important for you to complete the blood thinner medication as prescribed by your surgeon.  Continue your approved medications as instructed at time of discharge.  Information on my medicine - XARELTO (Rivaroxaban)  Why was  Xarelto prescribed for you? Xarelto was prescribed for you to reduce the risk of blood clots forming after orthopedic surgery. The medical term for these abnormal blood clots is venous thromboembolism (VTE).  What do you need to know about xarelto ? Take your Xarelto ONCE DAILY at the same time every day. You may take it either with or without food.  If you have difficulty swallowing the tablet whole, you may crush it and mix in applesauce just prior to taking your dose.  Take Xarelto exactly as prescribed by your doctor and DO NOT stop taking Xarelto without talking to the doctor who prescribed the medication.  Stopping without other VTE prevention medication to take the place of Xarelto may increase your risk of developing a clot.  After discharge, you should have regular check-up appointments with your healthcare provider that is prescribing your Xarelto.    What do you do if you miss a dose? If you miss a dose, take it as soon as you remember on the same day then continue your regularly   scheduled once daily regimen the next day. Do not take two doses of Xarelto on the same day.   Important Safety Information A possible side effect of Xarelto is bleeding. You should call your healthcare provider right away if you experience any of the following: ? Bleeding from an injury or your nose that does not stop. ? Unusual colored urine (red or dark brown) or unusual colored stools (red or black). ? Unusual bruising for unknown reasons. ? A serious fall or if you hit your head (even if there is no bleeding).  Some medicines may interact with Xarelto and might increase your risk of bleeding while on Xarelto. To help avoid this, consult your healthcare provider or pharmacist prior to using any new prescription or non-prescription medications, including herbals, vitamins, non-steroidal anti-inflammatory drugs (NSAIDs) and supplements.  This website has more information on Xarelto:  www.xarelto.com.    PRECAUTIONS If you experience chest pain or shortness of breath - call 911 immediately for transfer to the hospital emergency department.  If you develop a fever greater that 101 F, purulent drainage from wound, increased redness or drainage from wound, foul odor from the wound/dressing, or calf pain - CONTACT YOUR SURGEON.                                                   FOLLOW-UP APPOINTMENTS Make sure you keep all of your appointments after your operation with your surgeon and caregivers. You should call the office at the above phone number and make an appointment for approximately two weeks after the date of your surgery or on the date instructed by your surgeon outlined in the "After Visit Summary".  RANGE OF MOTION AND STRENGTHENING EXERCISES  Rehabilitation of the knee is important following a knee injury or an operation. After just a few days of immobilization, the muscles of the thigh which control the knee become weakened and shrink (atrophy). Knee exercises are designed to build up the tone and strength of the thigh muscles and to improve knee motion. Often times heat used for twenty to thirty minutes before working out will loosen up your tissues and help with improving the range of motion but do not use heat for the first two weeks following surgery. These exercises can be done on a training (exercise) mat, on the floor, on a table or on a bed. Use what ever works the best and is most comfortable for you Knee exercises include:  . Leg Lifts - While your knee is still immobilized in a splint or cast, you can do straight leg raises. Lift the leg to 60 degrees, hold for 3 sec, and slowly lower the leg. Repeat 10-20 times 2-3 times daily. Perform this exercise against resistance later as your knee gets better.  . Quad and Hamstring Sets - Tighten up the muscle on the front of the thigh (Quad) and hold for 5-10 sec. Repeat this 10-20 times hourly. Hamstring sets are done by  pushing the foot backward against an object and holding for 5-10 sec. Repeat as with quad sets.   Leg Slides: Lying on your back, slowly slide your foot toward your buttocks, bending your knee up off the floor (only go as far as is comfortable). Then slowly slide your foot back down until your leg is flat on the floor again.  Angel Wings: Lying   on your back spread your legs to the side as far apart as you can without causing discomfort.  A rehabilitation program following serious knee injuries can speed recovery and prevent re-injury in the future due to weakened muscles. Contact your doctor or a physical therapist for more information on knee rehabilitation.   IF YOU ARE TRANSFERRED TO A SKILLED REHAB FACILITY If the patient is transferred to a skilled rehab facility following release from the hospital, a list of the current medications will be sent to the facility for the patient to continue.  When discharged from the skilled rehab facility, please have the facility set up the patient's Home Health Physical Therapy prior to being released. Also, the skilled facility will be responsible for providing the patient with their medications at time of release from the facility to include their pain medication, the muscle relaxants, and their blood thinner medication. If the patient is still at the rehab facility at time of the two week follow up appointment, the skilled rehab facility will also need to assist the patient in arranging follow up appointment in our office and any transportation needs.  MAKE SURE YOU:  . Understand these instructions.  . Get help right away if you are not doing well or get worse.    Pick up stool softner and laxative for home use following surgery while on pain medications. Do not submerge incision under water. Please use good hand washing techniques while changing dressing each day. May shower starting three days after surgery. Please use a clean towel to pat the incision  dry following showers. Continue to use ice for pain and swelling after surgery. Do not use any lotions or creams on the incision until instructed by your surgeon. ___________________________________________ 

## 2018-11-30 NOTE — Interval H&P Note (Signed)
History and Physical Interval Note:  11/30/2018 8:12 AM  Joanna Barber  has presented today for surgery, with the diagnosis of Bilateral knee osteoarthritis.  The various methods of treatment have been discussed with the patient and family. After consideration of risks, benefits and other options for treatment, the patient has consented to  Procedure(s) with comments: TOTAL KNEE BILATERAL (Bilateral) - 144min as a surgical intervention.  The patient's history has been reviewed, patient examined, no change in status, stable for surgery.  I have reviewed the patient's chart and labs.  Questions were answered to the patient's satisfaction.     Pilar Plate Tiernan Millikin

## 2018-11-30 NOTE — Anesthesia Procedure Notes (Signed)
Procedure Name: MAC Date/Time: 11/30/2018 9:45 AM Performed by: Eben Burow, CRNA Pre-anesthesia Checklist: Patient identified, Emergency Drugs available, Suction available, Patient being monitored and Timeout performed Oxygen Delivery Method: Simple face mask Dental Injury: Teeth and Oropharynx as per pre-operative assessment

## 2018-11-30 NOTE — Op Note (Signed)
Pre-operative diagnosis- Osteoarthritis  Bilateral knee(s)  Post-operative diagnosis- Osteoarthritis Bilateral knee(s)  Procedure-  Bilateral  Total Knee Arthroplasty  Surgeon- Dione Plover. Lavere Shinsky, MD  Assistant- Griffith Citron, PA-C   Anesthesia-  Epidural  EBL-20 mL   Drains Hemovac x 1 each side  Tourniquet time-  Total Tourniquet Time Documented: Thigh (Left) - 44 minutes Total: Thigh (Left) - 44 minutes  Thigh (Right) - 60 minutes Total: Thigh (Right) - 60 minutes     Complications- None  Condition-PACU - hemodynamically stable.   Brief Clinical Note  Joanna Barber is a 53 y.o. year old female with end stage OA of both knees with progressively worsening pain and dysfunction. The patient has constant pain, with activity and at rest and significant functional deficits with difficulties even with ADLs. The patient has had extensive non-op management including analgesics, injections of cortisone and viscosupplements, and home exercise program, but remains in significant pain with significant dysfunction. We discussed replacing both knees in the same setting versus one at a time including procedure, risks, potential complications, rehab course, and pros and cons associated with each and the patient elects to do both knees at the same time. The patient presents now for bilateral Total Knee Arthroplasty.     Procedure in detail---   The patient is brought into the operating room and positioned supine on the operating table. After successful administration of  Epidural,   a tourniquet is placed high on the  Bilateral thigh(s) and the lower extremities are prepped and draped in the usual sterile fashion. Time out is performed by the operating team and then the  Left lower extremity is wrapped in Esmarch, knee flexed and the tourniquet inflated to 300 mmHg.       A midline incision is made with a ten blade through the subcutaneous tissue to the level of the extensor mechanism. A fresh  blade is used to make a medial parapatellar arthrotomy. Soft tissue over the proximal medial tibia is subperiosteally elevated to the joint line with a knife and into the semimembranosus bursa with a Cobb elevator. Soft tissue over the proximal lateral tibia is elevated with attention being paid to avoiding the patellar tendon on the tibial tubercle. The patella is everted, knee flexed 90 degrees and the ACL and PCL are removed. Findings are bone on bone medial and patellofemoral with massive global osteophytes        The drill is used to create a starting hole in the distal femur and the canal is thoroughly irrigated with sterile saline to remove the fatty contents. The 5 degree Left  valgus alignment guide is placed into the femoral canal and the distal femoral cutting block is pinned to remove 9 mm off the distal femur. Resection is made with an oscillating saw.      The tibia is subluxed forward and the menisci are removed. The extramedullary alignment guide is placed referencing proximally at the medial aspect of the tibial tubercle and distally along the second metatarsal axis and tibial crest. The block is pinned to remove 39mm off the more deficient medial  side. Resection is made with an oscillating saw. Size 4is the most appropriate size for the tibia and the proximal tibia is prepared with the modular drill and keel punch for that size.      The femoral sizing guide is placed and size 5 is most appropriate. Rotation is marked off the epicondylar axis and confirmed by creating a rectangular flexion gap at 90  degrees. The size 5 cutting block is pinned in this rotation and the anterior, posterior and chamfer cuts are made with the oscillating saw. The intercondylar block is then placed and that cut is made.      Trial size 4 tibial component, trial size 5 narrow posterior stabilized femur and a 10  mm posterior stabilized rotating platform insert trial is placed. Full extension is achieved with excellent  varus/valgus and anterior/posterior balance throughout full range of motion. The patella is everted and thickness measured to be 22  mm. Free hand resection is taken to 12 mm, a 35 template is placed, lug holes are drilled, trial patella is placed, and it tracks normally. Osteophytes are removed off the posterior femur with the trial in place. All trials are removed and the cut bone surfaces prepared with pulsatile lavage. Cement is mixed and once ready for implantation, the size 4 tibial implant, size  5 narrow posterior stabilized femoral component, and the size 35 patella are cemented in place and the patella is held with the clamp. The trial insert is placed and the knee held in full extension.  All extruded cement is removed and once the cement is hard the permanent 10 mm posterior stabilized rotating platform insert is placed into the tibial tray.      The wound is copiously irrigated with saline solution and the extensor mechanism closed over a hemovac drain with #1 V-loc suture. The tourniquet is released for a total tourniquet time of 44  minutes. Flexion against gravity is 140 degrees and the patella tracks normally. Subcutaneous tissue is closed with 2.0 vicryl and subcuticular with running 4.0 Monocryl.       The  Right lower extremity is wrapped in Esmarch, knee flexed and the tourniquet inflated to 300 mmHg.       A midline incision is made with a ten blade through the subcutaneous tissue to the level of the extensor mechanism. A fresh blade is used to make a medial parapatellar arthrotomy. Soft tissue over the proximal medial tibia is subperiosteally elevated to the joint line with a knife and into the semimembranosus bursa with a Cobb elevator. Soft tissue over the proximal lateral tibia is elevated with attention being paid to avoiding the patellar tendon on the tibial tubercle. The patella is everted, knee flexed 90 degrees and the ACL and PCL are removed. Findings are bone on bone medial and  patellofemoral with massive global osteophytes        The drill is used to create a starting hole in the distal femur and the canal is thoroughly irrigated with sterile saline to remove the fatty contents. The 5 degree Right  valgus alignment guide is placed into the femoral canal and the distal femoral cutting block is pinned to remove 9 mm off the distal femur. Resection is made with an oscillating saw.      The tibia is subluxed forward and the menisci are removed. The extramedullary alignment guide is placed referencing proximally at the medial aspect of the tibial tubercle and distally along the second metatarsal axis and tibial crest. The block is pinned to remove 2mm off the more deficient medial  side. Resection is made with an oscillating saw. I removed her ACL interference screw from the proximal tibia.Size 4is the most appropriate size for the tibia and the proximal tibia is prepared with the modular drill and keel punch for that size.      The femoral sizing guide is placed and size  5 is most appropriate. Rotation is marked off the epicondylar axis and confirmed by creating a rectangular flexion gap at 90 degrees. The size 5 cutting block is pinned in this rotation and the anterior, posterior and chamfer cuts are made with the oscillating saw. The intercondylar block is then placed and that cut is made.      Trial size 4 tibial component, trial size 5 narrow posterior stabilized femur and a 10  mm posterior stabilized rotating platform insert trial is placed. Full extension is achieved with excellent varus/valgus and anterior/posterior balance throughout full range of motion. The patella is everted and thickness measured to be 22  mm. Free hand resection is taken to 12 mm, a 35 template is placed, lug holes are drilled, trial patella is placed, and it tracks normally. Osteophytes are removed off the posterior femur with the trial in place. All trials are removed and the cut bone surfaces prepared  with pulsatile lavage. Cement is mixed and once ready for implantation, the size 4 tibial implant, size  5 narrow posterior stabilized femoral component, and the size 35 patella are cemented in place and the patella is held with the clamp. The trial insert is placed and the knee held in full extension.   All extruded cement is removed and once the cement is hard the permanent 10 mm posterior stabilized rotating platform insert is placed into the tibial tray.      The wound is copiously irrigated with saline solution and the extensor mechanism closed over a hemovac drain with #1 V-loc suture. The tourniquet is released for a total tourniquet time of 60  minutes. Flexion against gravity is 140 degrees and the patella tracks normally. Subcutaneous tissue is closed with 2.0 vicryl and subcuticular with running 4.0 Monocryl. The incisions are cleaned and dried and steri-strips and  bulky sterile dressings are applied. The limbs are placed into knee immobilizers and the patient is awakened and transported to recovery in stable condition.            Please note that a surgical assistant was a medical necessity for this procedure in order to perform it in a safe and expeditious manner. Surgical assistant was necessary to retract the ligaments and vital neurovascular structures to prevent injury to them and also necessary for proper positioning of the limb to allow for anatomic placement of the prosthesis.   Gus RankinFrank V. Zykia Walla, MD    11/30/2018, 12:46 PM

## 2018-11-30 NOTE — Progress Notes (Signed)
Inpatient Rehabilitation-Admissions Coordinator   Inpatient Rehab Consult received. Will follow for post surgical therapy evaluations.   Jhonnie Garner, OTR/L  Rehab Admissions Coordinator  [phone] 11/30/2018 4:01 PM

## 2018-11-30 NOTE — Anesthesia Procedure Notes (Signed)
Epidural Patient location during procedure: pre-op Start time: 11/30/2018 9:05 AM End time: 11/30/2018 9:20 AM  Staffing Anesthesiologist: Roderic Palau, MD Performed: anesthesiologist   Preanesthetic Checklist Completed: patient identified, site marked, surgical consent, pre-op evaluation, timeout performed, IV checked, risks and benefits discussed and monitors and equipment checked  Epidural Patient position: sitting Prep: Betadine, site prepped and draped and DuraPrep Patient monitoring: continuous pulse ox, blood pressure, heart rate and cardiac monitor Approach: midline Location: L2-L3 Injection technique: LOR air  Needle:  Needle type: Tuohy  Needle gauge: 17 G Needle length: 9 cm and 9 Needle insertion depth: 7 cm Catheter type: closed end flexible Catheter size: 19 Gauge Catheter at skin depth: 12 cm Test dose: negative and 2% lidocaine with Epi 1:200 K  Assessment Sensory level: T5  Additional Notes First attempt at L3-4 resulted in dural puncture. Needle removed and went up a level without complication.Reason for block:at surgeon's request, post-op pain management and surgical anesthesia

## 2018-12-01 ENCOUNTER — Encounter (HOSPITAL_COMMUNITY): Payer: Self-pay | Admitting: Orthopedic Surgery

## 2018-12-01 LAB — BASIC METABOLIC PANEL
Anion gap: 12 (ref 5–15)
BUN: 11 mg/dL (ref 6–20)
CO2: 22 mmol/L (ref 22–32)
Calcium: 8.5 mg/dL — ABNORMAL LOW (ref 8.9–10.3)
Chloride: 104 mmol/L (ref 98–111)
Creatinine, Ser: 0.53 mg/dL (ref 0.44–1.00)
GFR calc Af Amer: >60 mL/min (ref >60)
GFR calc non Af Amer: >60 mL/min (ref >60)
Glucose, Bld: 138 mg/dL — ABNORMAL HIGH (ref 70–99)
Potassium: 3.4 mmol/L — ABNORMAL LOW (ref 3.5–5.1)
Sodium: 138 mmol/L (ref 135–145)

## 2018-12-01 LAB — CBC
HCT: 30.5 % — ABNORMAL LOW (ref 36.0–46.0)
Hemoglobin: 9.9 g/dL — ABNORMAL LOW (ref 12.0–15.0)
MCH: 32.9 pg (ref 26.0–34.0)
MCHC: 32.5 g/dL (ref 30.0–36.0)
MCV: 101.3 fL — ABNORMAL HIGH (ref 80.0–100.0)
Platelets: 258 10*3/uL (ref 150–400)
RBC: 3.01 MIL/uL — ABNORMAL LOW (ref 3.87–5.11)
RDW: 11.9 % (ref 11.5–15.5)
WBC: 13.3 10*3/uL — ABNORMAL HIGH (ref 4.0–10.5)
nRBC: 0 % (ref 0.0–0.2)

## 2018-12-01 MED ORDER — POTASSIUM CHLORIDE CRYS ER 20 MEQ PO TBCR
40.0000 meq | EXTENDED_RELEASE_TABLET | Freq: Once | ORAL | Status: AC
  Administered 2018-12-01: 40 meq via ORAL
  Filled 2018-12-01: qty 2

## 2018-12-01 NOTE — Progress Notes (Signed)
   Subjective: 1 Day Post-Op Procedure(s) (LRB): TOTAL KNEE BILATERAL (Bilateral) Patient reports pain as mild.   Patient seen in rounds with Dr. Wynelle Link. Patient is well, and has had no acute complaints or problems. Resting comfortably in bed, states pain is minimal. Denies chest pain, SOB, or calf pain. Foley catheter was removed this AM, will need new foley reinserted due to epidural placement. No issues overnight.  We will continue therapy today.   Objective: Vital signs in last 24 hours: Temp:  [97.4 F (36.3 C)-98.9 F (37.2 C)] 98.5 F (36.9 C) (07/16 0501) Pulse Rate:  [66-117] 83 (07/16 0501) Resp:  [12-20] 18 (07/16 0629) BP: (94-164)/(50-86) 106/75 (07/16 0501) SpO2:  [96 %-100 %] 96 % (07/16 0501) Weight:  [84.5 kg] 84.5 kg (07/15 0820)  Intake/Output from previous day:  Intake/Output Summary (Last 24 hours) at 12/01/2018 0734 Last data filed at 12/01/2018 0558 Gross per 24 hour  Intake 5544.69 ml  Output 3925 ml  Net 1619.69 ml     Intake/Output this shift: No intake/output data recorded.  Labs: Recent Labs    11/28/18 1215 12/01/18 0245  HGB 12.4 9.9*   Recent Labs    11/28/18 1215 12/01/18 0245  WBC 6.5 13.3*  RBC 3.82* 3.01*  HCT 38.5 30.5*  PLT 264 258   Recent Labs    11/28/18 1215 12/01/18 0245  NA 139 138  K 3.9 3.4*  CL 100 104  CO2 25 22  BUN 18 11  CREATININE 0.65 0.53  GLUCOSE 114* 138*  CALCIUM 9.6 8.5*   Recent Labs    11/28/18 1215  INR 1.0    Exam: General - Patient is Alert and Oriented Extremity - Neurologically intact Neurovascular intact Sensation intact distally Dorsiflexion/Plantar flexion intact Dressing - dressing C/D/I Motor Function - intact, moving foot and toes well on exam.   Past Medical History:  Diagnosis Date  . Arthritis    bi lat knees  . Hypertension     Assessment/Plan: 1 Day Post-Op Procedure(s) (LRB): TOTAL KNEE BILATERAL (Bilateral) Principal Problem:   OA (osteoarthritis) of  knee  Estimated body mass index is 34.06 kg/m as calculated from the following:   Height as of this encounter: 5\' 2"  (1.575 m).   Weight as of this encounter: 84.5 kg. Advance diet Up with therapy  DVT Prophylaxis - Aspirin 325 mg BID, will switch to Xarelto 10 mg QD on POD #3 following epidural removal Weight bearing as tolerated. D/C O2 and pulse ox and try on room air. Left knee hemovac pulled without difficulty, will continue therapy today.  Plan is to go to Inpatient Rehab after hospital stay, consult placed.  Hemoglobin stable at 9.9, will continue to monitor. Potassium dropped from 3.9 to 3.4, will order one dose of 40 mEq KCl. Foley to be reinserted due to epidural placement, will not be removed until at least 6 hours following epidural discontinuation on POD #2.   Disposition: pending insurance approval, hopefully discharge to CIR either Saturday or Monday  Theresa Duty, PA-C Orthopedic Surgery 12/01/2018, 7:34 AM

## 2018-12-01 NOTE — Progress Notes (Signed)
Physical Therapy Treatment Patient Details Name: Joanna Barber MRN: [id number] DOB: 08-Jul-1965 Today's Date: 12/01/2018    History of Present Illness Pt s/p Bil TKR    PT Comments    Pt motivated to progress - continues to require mod assist of two for performance of all mobility tasks.   Follow Up Recommendations  CIR     Equipment Recommendations  None recommended by PT    Recommendations for Other Services       Precautions / Restrictions Precautions Precautions: Knee;Fall Required Braces or Orthoses: Knee Immobilizer - Right;Knee Immobilizer - Left Knee Immobilizer - Right: Discontinue once straight leg raise with < 10 degree lag Knee Immobilizer - Left: Discontinue once straight leg raise with < 10 degree lag Restrictions Weight Bearing Restrictions: No Other Position/Activity Restrictions: WBAT    Mobility  Bed Mobility Overal bed mobility: Needs Assistance Bed Mobility: Sit to Supine     Supine to sit: Mod assist;+2 for physical assistance;+2 for safety/equipment Sit to supine: Mod assist;+2 for physical assistance;+2 for safety/equipment   General bed mobility comments: cues for sequence with assist to manage bil LEs and to control trunk   Transfers Overall transfer level: Needs assistance Equipment used: Rolling walker (2 wheeled) Transfers: Sit to/from Stand Sit to Stand: Mod assist;+2 physical assistance;+2 safety/equipment         General transfer comment: cues for sequence to walk LEs backward as UEs elevate from chair  Ambulation/Gait Ambulation/Gait assistance: Mod assist;+2 physical assistance;+2 safety/equipment Gait Distance (Feet): 10 Feet(twice) Assistive device: Rolling walker (2 wheeled) Gait Pattern/deviations: Step-to pattern;Shuffle;Trunk flexed;Decreased step length - right;Decreased step length - left Gait velocity: decr   General Gait Details: cues for sequence, posture and position from [employer]   Stairs              Wheelchair Mobility    Modified Rankin (Stroke Patients Only)       Balance Overall balance assessment: Needs assistance Sitting-balance support: Bilateral upper extremity supported;Feet supported Sitting balance-[name] Scale: Fair     Standing balance support: Bilateral upper extremity supported Standing balance-[name] Scale: Poor                              Cognition Arousal/Alertness: Awake/alert Behavior During Therapy: WFL for tasks assessed/performed Overall Cognitive Status: Within Functional Limits for tasks assessed                                        Exercises Total Joint Exercises Ankle Circles/Pumps: AROM;Both;15 reps;Supine Quad Sets: AROM;Both;10 reps;Supine Heel Slides: AAROM;Both;10 reps;Supine Straight Leg Raises: AAROM;Both;10 reps;Supine    General Comments        Pertinent Vitals/Pain Pain Assessment: 0-10 Pain Score: 6  Pain Location: bil knees Pain Descriptors / Indicators: Aching;Sore Pain Intervention(s): Limited activity within patient's tolerance;Monitored during session;Premedicated before session;Ice applied    Home Living Family/patient expects to be discharged to:: Inpatient rehab                    Prior Function Level of Independence: Independent          PT Goals (current goals can now be found in the care plan section) Acute Rehab PT Goals Patient Stated Goal: Regain IND PT Goal Formulation: With patient Time For Goal Achievement: 12/15/18 Potential to Achieve Goals: Good Progress towards PT goals: Progressing toward  goals    Frequency    7X/week      PT Plan Current plan remains appropriate    Co-evaluation              AM-PAC PT "6 Clicks" Mobility   Outcome Measure  Help needed turning from your back to your side while in a flat bed without using bedrails?: A Lot Help needed moving from lying on your back to sitting on the side of a flat bed without using  bedrails?: A Lot Help needed moving to and from a bed to a chair (including a wheelchair)?: A Lot Help needed standing up from a chair using your arms (e.g., wheelchair or bedside chair)?: A Lot Help needed to walk in hospital room?: A Lot Help needed climbing 3-5 steps with a railing? : Total 6 Click Score: 11    End of Session Equipment Utilized During Treatment: Gait belt;Right knee immobilizer;Left knee immobilizer Activity Tolerance: Patient tolerated treatment well Patient left: in bed;with call bell/phone within reach;with chair alarm set Nurse Communication: Mobility status PT Visit Diagnosis: Difficulty in walking, not elsewhere classified (R26.2)     Time: 1410-1433 PT Time Calculation (min) (ACUTE ONLY): 23 min  Charges:  $Gait Training: 8-22 mins $Therapeutic Exercise: 8-22 mins $Therapeutic Activity: 8-22 mins                     Beatty Pager [phone] Office [phone]    Avani Sensabaugh 12/01/2018, 2:36 PM

## 2018-12-01 NOTE — Anesthesia Post-op Follow-up Note (Signed)
  Anesthesia Pain Follow-up Note  Patient: Joanna Barber  Post op Day #: 1  Date of Follow-up: 12/01/2018 Time: 5:07 PM  Last Vitals:  Vitals:   12/01/18 0958 12/01/18 1351  BP: (!) 156/67 130/64  Pulse: (!) 102 90  Resp: 16 16  Temp:    SpO2: 100% 97%    Level of Consciousness: alert  Pain: mild   Side Effects:None  Catheter Site Exam:clean, dry, no drainage  Epidural / Intrathecal (From admission, onward)   Start     Dose/Rate Route Frequency Ordered Stop   11/30/18 1015  ropivacaine (PF) 2 mg/mL (0.2%) (NAROPIN) injection     10 mL/hr 10 mL/hr  Epidural Continuous 11/30/18 1012         Plan: Continue current therapy of postop epidural at surgeon's request  Lidia Collum

## 2018-12-01 NOTE — Progress Notes (Signed)
Inpatient Rehab Admissions:  Inpatient Rehab Consult received. I spoke with pt via phone for rehabilitation assessment and to discuss goals and expectations of an inpatient rehab admission.  Pt interested in CIR program and would like to proceed. Will need OT evaluation in order to open insurance case for CIR request (Ortho PA notified of request for OT order).   Please call if questions.   Jhonnie Garner, OTR/L  Rehab Admissions Coordinator  [phone] 12/01/2018 3:59 PM

## 2018-12-01 NOTE — Addendum Note (Signed)
Addendum  created 12/01/18 1707 by Lidia Collum, MD   Clinical Note Signed

## 2018-12-01 NOTE — Evaluation (Signed)
Physical Therapy Evaluation Patient Details Name: Joanna Barber MRN: [id number] DOB: [birth year] Today's Date: 12/01/2018   History of Present Illness  Pt s/p Bil TKR  Clinical Impression  Pt s/p bil TKR and presents with decreased bil LE strength/ROM and post op pain limiting functional mobility.  Pt would benefit from follow CIR level PT to maximize IND for return home with limited assist.    Follow Up Recommendations CIR    Equipment Recommendations  None recommended by PT    Recommendations for Other Services       Precautions / Restrictions Precautions Precautions: Knee;Fall Required Braces or Orthoses: Knee Immobilizer - Right;Knee Immobilizer - Left Knee Immobilizer - Right: Discontinue once straight leg raise with < 10 degree lag Knee Immobilizer - Left: Discontinue once straight leg raise with < 10 degree lag Restrictions Weight Bearing Restrictions: No Other Position/Activity Restrictions: WBAT      Mobility  Bed Mobility Overal bed mobility: Needs Assistance Bed Mobility: Supine to Sit     Supine to sit: Mod assist;+2 for physical assistance;+2 for safety/equipment     General bed mobility comments: cues for sequence with assist to manage bil LEs and to bring trunk to upright  Transfers Overall transfer level: Needs assistance Equipment used: Rolling walker (2 wheeled) Transfers: Sit to/from Stand Sit to Stand: Mod assist;+2 physical assistance;+2 safety/equipment         General transfer comment: cues for LE management and use of UEs to self assist  Ambulation/Gait Ambulation/Gait assistance: Mod assist;+2 physical assistance;+2 safety/equipment Gait Distance (Feet): 10 Feet Assistive device: Rolling walker (2 wheeled) Gait Pattern/deviations: Step-to pattern;Shuffle;Trunk flexed;Decreased step length - right;Decreased step length - left Gait velocity: decr   General Gait Details: cues for sequence, posture and position from [employer]             Wheelchair Mobility    Modified Rankin (Stroke Patients Only)       Balance Overall balance assessment: Needs assistance Sitting-balance support: Bilateral upper extremity supported;Feet supported Sitting balance-[name] Scale: Fair     Standing balance support: Bilateral upper extremity supported Standing balance-[name] Scale: Poor                               Pertinent Vitals/Pain Pain Assessment: 0-10 Pain Score: 6  Pain Location: bil knees Pain Descriptors / Indicators: Aching;Sore Pain Intervention(s): Limited activity within patient's tolerance;Monitored during session;Premedicated before session;Ice applied    Home Living Family/patient expects to be discharged to:: Inpatient rehab                      Prior Function Level of Independence: Independent               Hand Dominance        Extremity/Trunk Assessment   Upper Extremity Assessment Upper Extremity Assessment: Overall WFL for tasks assessed    Lower Extremity Assessment Lower Extremity Assessment: RLE deficits/detail;LLE deficits/detail RLE Deficits / Details: 2+/3 quads with AAROM at knee -10 - 65 LLE Deficits / Details: 2/5 quads with AAROM at knee -10 - 35    Cervical / Trunk Assessment Cervical / Trunk Assessment: Normal  Communication   Communication: No difficulties  Cognition Arousal/Alertness: Awake/alert Behavior During Therapy: WFL for tasks assessed/performed Overall Cognitive Status: Within Functional Limits for tasks assessed  General Comments      Exercises Total Joint Exercises Ankle Circles/Pumps: AROM;Both;15 reps;Supine Quad Sets: AROM;Both;10 reps;Supine Heel Slides: AAROM;Both;10 reps;Supine Straight Leg Raises: AAROM;Both;10 reps;Supine   Assessment/Plan    PT Assessment Patient needs continued PT services  PT Problem List Decreased strength;Decreased range of  motion;Decreased activity tolerance;Decreased balance;Decreased mobility;Decreased knowledge of use of DME;Pain       PT Treatment Interventions DME instruction;Gait training;Functional mobility training;Therapeutic activities;Therapeutic exercise;Patient/family education    PT Goals (Current goals can be found in the Care Plan section)  Acute Rehab PT Goals Patient Stated Goal: Regain IND PT Goal Formulation: With patient Time For Goal Achievement: 12/15/18 Potential to Achieve Goals: Good    Frequency 7X/week   Barriers to discharge        Co-evaluation               AM-PAC PT "6 Clicks" Mobility  Outcome Measure Help needed turning from your back to your side while in a flat bed without using bedrails?: A Lot Help needed moving from lying on your back to sitting on the side of a flat bed without using bedrails?: A Lot Help needed moving to and from a bed to a chair (including a wheelchair)?: A Lot Help needed standing up from a chair using your arms (e.g., wheelchair or bedside chair)?: A Lot Help needed to walk in hospital room?: A Lot Help needed climbing 3-5 steps with a railing? : Total 6 Click Score: 11    End of Session Equipment Utilized During Treatment: Gait belt;Right knee immobilizer;Left knee immobilizer Activity Tolerance: Patient tolerated treatment well Patient left: in chair;with call bell/phone within reach;with chair alarm set Nurse Communication: Mobility status PT Visit Diagnosis: Difficulty in walking, not elsewhere classified (R26.2)    Time: 1610-96041006-1044 PT Time Calculation (min) (ACUTE ONLY): 38 min   Charges:   PT Evaluation $PT Eval Low Complexity: 1 Low PT Treatments $Gait Training: 8-22 mins $Therapeutic Exercise: 8-22 mins        Mauro KaufmannHunter Masashi Snowdon PT Acute Rehabilitation Services Pager [phone] Office [phone]   Cayleigh Paull 12/01/2018, 2:04 PM

## 2018-12-01 NOTE — Progress Notes (Signed)
Inpatient Rehabilitation-Admissions Coordinator   [facility] has attempted to contact pt via phone x 2. Will continue attempts to reach out to discuss interest in CIR program.   Jhonnie Garner, OTR/L  Rehab Admissions Coordinator  [phone] 12/01/2018 2:51 PM

## 2018-12-02 ENCOUNTER — Encounter (HOSPITAL_COMMUNITY): Payer: Self-pay | Admitting: Orthopedic Surgery

## 2018-12-02 LAB — CBC
HCT: 28.5 % — ABNORMAL LOW (ref 36.0–46.0)
Hemoglobin: 9 g/dL — ABNORMAL LOW (ref 12.0–15.0)
MCH: 32.7 pg (ref 26.0–34.0)
MCHC: 31.6 g/dL (ref 30.0–36.0)
MCV: 103.6 fL — ABNORMAL HIGH (ref 80.0–100.0)
Platelets: 250 10*3/uL (ref 150–400)
RBC: 2.75 MIL/uL — ABNORMAL LOW (ref 3.87–5.11)
RDW: 12.3 % (ref 11.5–15.5)
WBC: 18.3 10*3/uL — ABNORMAL HIGH (ref 4.0–10.5)
nRBC: 0 % (ref 0.0–0.2)

## 2018-12-02 LAB — BASIC METABOLIC PANEL
Anion gap: 10 (ref 5–15)
BUN: 13 mg/dL (ref 6–20)
CO2: 24 mmol/L (ref 22–32)
Calcium: 8.5 mg/dL — ABNORMAL LOW (ref 8.9–10.3)
Chloride: 105 mmol/L (ref 98–111)
Creatinine, Ser: 0.51 mg/dL (ref 0.44–1.00)
GFR calc Af Amer: >60 mL/min (ref >60)
GFR calc non Af Amer: >60 mL/min (ref >60)
Glucose, Bld: 137 mg/dL — ABNORMAL HIGH (ref 70–99)
Potassium: 3.8 mmol/L (ref 3.5–5.1)
Sodium: 139 mmol/L (ref 135–145)

## 2018-12-02 MED ORDER — METHOCARBAMOL 500 MG PO TABS: 500.0000 mg | ORAL_TABLET | Freq: Four times a day (QID) | ORAL | 0 refills | Status: DC | PRN

## 2018-12-02 MED ORDER — HYDROMORPHONE HCL 2 MG PO TABS
2.0000 mg | ORAL_TABLET | ORAL | Status: DC | PRN
  Administered 2018-12-04 – 2018-12-07 (×11): 2 mg via ORAL
  Filled 2018-12-02 (×11): qty 1

## 2018-12-02 MED ORDER — GABAPENTIN 300 MG PO CAPS: 300.0000 mg | ORAL_CAPSULE | Freq: Three times a day (TID) | ORAL | 0 refills | Status: AC

## 2018-12-02 MED ORDER — HYDROMORPHONE HCL 2 MG PO TABS
4.0000 mg | ORAL_TABLET | ORAL | Status: DC | PRN
  Administered 2018-12-02 – 2018-12-09 (×34): 4 mg via ORAL
  Filled 2018-12-02 (×35): qty 2

## 2018-12-02 MED ORDER — ACETAMINOPHEN 500 MG PO TABS
1000.0000 mg | ORAL_TABLET | Freq: Four times a day (QID) | ORAL | Status: DC | PRN
  Administered 2018-12-04: 1000 mg via ORAL
  Filled 2018-12-02: qty 2

## 2018-12-02 MED ORDER — RIVAROXABAN 10 MG PO TABS: 10.0000 mg | ORAL_TABLET | Freq: Every day | ORAL | 0 refills | Status: DC

## 2018-12-02 MED ORDER — ACETAMINOPHEN 325 MG PO TABS: 650.0000 mg | ORAL_TABLET | Freq: Four times a day (QID) | ORAL | Status: DC | PRN

## 2018-12-02 MED ORDER — NAPROXEN 250 MG PO TABS
500.0000 mg | ORAL_TABLET | Freq: Two times a day (BID) | ORAL | Status: DC
  Administered 2018-12-02 – 2018-12-09 (×12): 500 mg via ORAL
  Filled 2018-12-02 (×14): qty 2

## 2018-12-02 MED ORDER — RIVAROXABAN 10 MG PO TABS
10.0000 mg | ORAL_TABLET | Freq: Every day | ORAL | Status: DC
  Administered 2018-12-03 – 2018-12-09 (×7): 10 mg via ORAL
  Filled 2018-12-02 (×7): qty 1

## 2018-12-02 MED ORDER — HYDROMORPHONE HCL 2 MG PO TABS: 2.0000 mg | ORAL_TABLET | Freq: Four times a day (QID) | ORAL | 0 refills | Status: DC | PRN

## 2018-12-02 NOTE — Plan of Care (Signed)

## 2018-12-02 NOTE — Progress Notes (Signed)
Physical Therapy Treatment Patient Details Name: Joanna Barber MRN: [id number] DOB: 1965-10-11 Today's Date: 12/02/2018    History of Present Illness Pt s/p Bil TKR    PT Comments    Pt performed therex program with assist and multiple rest breaks.  OOB deferred to after epidural dc.   Follow Up Recommendations  CIR     Equipment Recommendations  None recommended by PT    Recommendations for Other Services       Precautions / Restrictions Precautions Precautions: Knee;Fall Required Braces or Orthoses: Knee Immobilizer - Right;Knee Immobilizer - Left Knee Immobilizer - Right: Discontinue once straight leg raise with < 10 degree lag Knee Immobilizer - Left: Discontinue once straight leg raise with < 10 degree lag Restrictions Weight Bearing Restrictions: No Other Position/Activity Restrictions: WBAT    Mobility  Bed Mobility               General bed mobility comments: OOB deferred to after pain meds and epidural dc  Transfers                    Ambulation/Gait                 Stairs             Wheelchair Mobility    Modified Rankin (Stroke Patients Only)       Balance                                            Cognition Arousal/Alertness: Awake/alert Behavior During Therapy: WFL for tasks assessed/performed Overall Cognitive Status: Within Functional Limits for tasks assessed                                        Exercises Total Joint Exercises Ankle Circles/Pumps: AROM;Both;15 reps;Supine Quad Sets: AROM;Both;10 reps;Supine Heel Slides: AAROM;Both;10 reps;Supine Straight Leg Raises: AAROM;Both;10 reps;Supine Goniometric ROM: AAROM R knee -8 - 45; L knee -8 - 35 - Pain limited    General Comments        Pertinent Vitals/Pain Pain Assessment: 0-10 Pain Score: 8  Pain Location: L knee with flexion; 6/10 R knee Pain Descriptors / Indicators: Aching;Sore Pain Intervention(s):  Limited activity within patient's tolerance;Monitored during session;Premedicated before session;Ice applied    Home Living                      Prior Function            PT Goals (current goals can now be found in the care plan section) Acute Rehab PT Goals Patient Stated Goal: Regain IND PT Goal Formulation: With patient Time For Goal Achievement: 12/15/18 Potential to Achieve Goals: Good Progress towards PT goals: Progressing toward goals    Frequency    7X/week      PT Plan Current plan remains appropriate    Co-evaluation              AM-PAC PT "6 Clicks" Mobility   Outcome Measure  Help needed turning from your back to your side while in a flat bed without using bedrails?: A Lot Help needed moving from lying on your back to sitting on the side of a flat bed without using bedrails?: A Lot Help needed moving to and  from a bed to a chair (including a wheelchair)?: A Lot Help needed standing up from a chair using your arms (e.g., wheelchair or bedside chair)?: A Lot Help needed to walk in hospital room?: A Lot Help needed climbing 3-5 steps with a railing? : Total 6 Click Score: 11    End of Session   Activity Tolerance: Patient limited by pain Patient left: in bed;with call bell/phone within reach;with chair alarm set Nurse Communication: Patient requests pain meds PT Visit Diagnosis: Difficulty in walking, not elsewhere classified (R26.2)     Time: [id number] PT Time Calculation (min) (ACUTE ONLY): 25 min  Charges:  $Therapeutic Exercise: 8-22 mins                     Joanna Barber PT Acute Rehabilitation Services Pager [phone] Office [phone]    Joanna Barber 12/02/2018, 8:33 AM

## 2018-12-02 NOTE — Progress Notes (Signed)
Physical Therapy Treatment Patient Details Name: Joanna Barber MRN: [id number] DOB: 04-30-1966 Today's Date: 12/02/2018    History of Present Illness Pt s/p Bil TKR    PT Comments    Pt continues motivated and progressing steadily with mobility.  Epidural remains in place.   Follow Up Recommendations  CIR     Equipment Recommendations  None recommended by PT    Recommendations for Other Services       Precautions / Restrictions Precautions Precautions: Knee;Fall Required Braces or Orthoses: Knee Immobilizer - Right;Knee Immobilizer - Left Knee Immobilizer - Right: Discontinue once straight leg raise with < 10 degree lag Knee Immobilizer - Left: Discontinue once straight leg raise with < 10 degree lag Restrictions Weight Bearing Restrictions: No Other Position/Activity Restrictions: WBAT    Mobility  Bed Mobility Overal bed mobility: Needs Assistance Bed Mobility: Supine to Sit     Supine to sit: Min assist;+2 for safety/equipment;HOB elevated     General bed mobility comments: cues for sequence with assist to manage LEs and steady trunk  Transfers Overall transfer level: Needs assistance Equipment used: Rolling walker (2 wheeled) Transfers: Sit to/from Stand Sit to Stand: Mod assist;+2 physical assistance;+2 safety/equipment         General transfer comment: cues for sequence to walk LEs backward as UEs elevate from chair  Ambulation/Gait Ambulation/Gait assistance: Min assist;Mod assist;+2 physical assistance;+2 safety/equipment Gait Distance (Feet): 20 Feet Assistive device: Rolling walker (2 wheeled) Gait Pattern/deviations: Step-to pattern;Shuffle;Trunk flexed;Decreased step length - right;Decreased step length - left Gait velocity: decr   General Gait Details: cues for sequence, posture and position from [employer]             Wheelchair Mobility    Modified Rankin (Stroke Patients Only)       Balance Overall balance  assessment: Needs assistance Sitting-balance support: Bilateral upper extremity supported;Feet supported Sitting balance-[name] Scale: Fair     Standing balance support: Bilateral upper extremity supported Standing balance-[name] Scale: Poor                              Cognition Arousal/Alertness: Awake/alert Behavior During Therapy: WFL for tasks assessed/performed Overall Cognitive Status: Within Functional Limits for tasks assessed                                        Exercises      General Comments        Pertinent Vitals/Pain Pain Assessment: 0-10 Pain Score: 5  Pain Location: bil knees Pain Descriptors / Indicators: Aching;Sore Pain Intervention(s): Limited activity within patient's tolerance;Premedicated before session;Monitored during session;Ice applied    Home Living Family/patient expects to be discharged to:: Inpatient rehab Living Arrangements: Spouse/significant other                  Prior Function Level of Independence: Independent          PT Goals (current goals can now be found in the care plan section) Acute Rehab PT Goals Patient Stated Goal: Regain IND PT Goal Formulation: With patient Time For Goal Achievement: 12/15/18 Potential to Achieve Goals: Good Progress towards PT goals: Progressing toward goals    Frequency    7X/week      PT Plan Current plan remains appropriate    Co-evaluation PT/OT/SLP Co-Evaluation/Treatment: Yes Reason for Co-Treatment: For patient/therapist safety  PT goals addressed during session: Mobility/safety with mobility OT goals addressed during session: ADL's and self-care      AM-PAC PT "6 Clicks" Mobility   Outcome Measure  Help needed turning from your back to your side while in a flat bed without using bedrails?: A Lot Help needed moving from lying on your back to sitting on the side of a flat bed without using bedrails?: A Lot Help needed moving to and from a  bed to a chair (including a wheelchair)?: A Lot Help needed standing up from a chair using your arms (e.g., wheelchair or bedside chair)?: A Lot Help needed to walk in hospital room?: A Lot Help needed climbing 3-5 steps with a railing? : Total 6 Click Score: 11    End of Session Equipment Utilized During Treatment: Gait belt;Right knee immobilizer;Left knee immobilizer Activity Tolerance: Patient tolerated treatment well;Patient limited by fatigue;Patient limited by pain Patient left: in chair;with call bell/phone within reach;with chair alarm set Nurse Communication: Mobility status PT Visit Diagnosis: Difficulty in walking, not elsewhere classified (R26.2)     Time: [id number] PT Time Calculation (min) (ACUTE ONLY): 24 min  Charges:  $Gait Training: 8-22 mins                     Joanna KaufmannHunter Daly Barber PT Acute Rehabilitation Services Pager [phone] Office [phone]    Joanna Barber 12/02/2018, 12:40 PM

## 2018-12-02 NOTE — Evaluation (Signed)
Occupational Therapy Evaluation Patient Details Name: Joanna Barber MRN: [id number] DOB: 28-Oct-1965 Today's Date: 12/02/2018    History of Present Illness Pt s/p Bil TKR   Clinical Impression   This 53 year old female was admitted for the above sx. At baseline, she is independent and active.  She currently needs mod +2 assistance to stand for transfers and LB adls.  Of note, the epidural is still in place.  Pt is very motivated and would be a great CIR candidate. Goals in acute setting are set for min A level for most adls and transfers    Follow Up Recommendations  CIR    Equipment Recommendations  3 in 1 bedside commode    Recommendations for Other Services       Precautions / Restrictions Precautions Precautions: Knee;Fall Required Braces or Orthoses: Knee Immobilizer - Right;Knee Immobilizer - Left Knee Immobilizer - Right: Discontinue once straight leg raise with < 10 degree lag Knee Immobilizer - Left: Discontinue once straight leg raise with < 10 degree lag Restrictions Other Position/Activity Restrictions: WBAT      Mobility Bed Mobility         Supine to sit: Min assist;+2 for safety/equipment;HOB elevated        Transfers   Equipment used: Rolling walker (2 wheeled) Transfers: Sit to/from Stand Sit to Stand: Mod assist;+2 physical assistance;+2 safety/equipment         General transfer comment: cues for sequence to walk LEs backward as UEs elevate from chair    Balance     Sitting balance-[name] Scale: Fair       Standing balance-[name] Scale: Poor                             ADL either performed or assessed with clinical judgement   ADL Overall ADL's : Needs assistance/impaired     Grooming: Set up   Upper Body Bathing: Set up   Lower Body Bathing: Moderate assistance;+2 for physical assistance   Upper Body Dressing : Min guard(for lines)   Lower Body Dressing: Maximal assistance;+2 for physical assistance;Sit  to/from stand   Toilet Transfer: Moderate assistance;+2 for physical assistance;Ambulation   Toileting- Clothing Manipulation and Hygiene: Maximal assistance;+2 for physical assistance;Sit to/from stand         General ADL Comments: educated on reacher for adls.  She is interested in getting this.     Vision         Perception     Praxis      Pertinent Vitals/Pain Pain Assessment: 0-10 Pain Score: 5  Pain Location: bil knees Pain Descriptors / Indicators: Aching;Sore Pain Intervention(s): Limited activity within patient's tolerance;Monitored during session;Premedicated before session;Repositioned     Hand Dominance     Extremity/Trunk Assessment Upper Extremity Assessment Upper Extremity Assessment: Overall WFL for tasks assessed           Communication Communication Communication: No difficulties   Cognition Arousal/Alertness: Awake/alert Behavior During Therapy: WFL for tasks assessed/performed Overall Cognitive Status: Within Functional Limits for tasks assessed                                     General Comments       Exercises     Shoulder Instructions      Home Living Family/patient expects to be discharged to:: Inpatient rehab Living Arrangements: Spouse/significant other  Prior Functioning/Environment Level of Independence: Independent                 OT Problem List: Decreased strength;Decreased activity tolerance;Impaired balance (sitting and/or standing);Pain;Decreased knowledge of use of DME or AE;Decreased knowledge of precautions      OT Treatment/Interventions: Self-care/ADL training;DME and/or AE instruction;Patient/family education;Balance training;Therapeutic activities    OT Goals(Current goals can be found in the care plan section) Acute Rehab OT Goals Patient Stated Goal: Regain IND OT Goal Formulation: With patient Time For Goal Achievement:  12/16/18 Potential to Achieve Goals: Good  OT Frequency: Min 2X/week   Barriers to D/C:            Co-evaluation PT/OT/SLP Co-Evaluation/Treatment: Yes Reason for Co-Treatment: For patient/therapist safety PT goals addressed during session: Mobility/safety with mobility OT goals addressed during session: ADL's and self-care      AM-PAC OT "6 Clicks" Daily Activity     Outcome Measure Help from another person eating meals?: None Help from another person taking care of personal grooming?: A Little Help from another person toileting, which includes using toliet, bedpan, or urinal?: A Lot Help from another person bathing (including washing, rinsing, drying)?: A Lot Help from another person to put on and taking off regular upper body clothing?: A Little Help from another person to put on and taking off regular lower body clothing?: A Lot 6 Click Score: 16   End of Session    Activity Tolerance: Patient tolerated treatment well Patient left: in chair;with call bell/phone within reach;with chair alarm set  OT Visit Diagnosis: Muscle weakness (generalized) (M62.81);Pain Pain - part of body: Knee(bil)                Time: 5784-6962 OT Time Calculation (min): 35 min Charges:  OT General Charges $OT Visit: 1 Visit OT Evaluation $OT Eval Low Complexity: [place], OTR/L Acute Rehabilitation Services [phone] [facility] pager [phone] office 12/02/2018  [place] 12/02/2018, 12:23 PM

## 2018-12-02 NOTE — Anesthesia Post-op Follow-up Note (Signed)
  Anesthesia Pain Follow-up Note  Patient: Joanna Barber  Day #: 2, No anticoagulants given per MRA and patient. RN caring for patient did not know if she had received any   Date of Follow-up: 12/02/2018 Time: 3:07 PM  Last Vitals:  Vitals:   12/02/18 0507 12/02/18 1441  BP: 124/81 (!) 164/84  Pulse: 79 82  Resp: 16 16  Temp: 36.8 C 36.7 C  SpO2: 97% 100%    Level of Consciousness: alert  Pain: mild   Side Effects:None  Catheter Site Exam:clean, dry, no drainage  Anti-Coag Meds (From admission, onward)   Start     Dose/Rate Route Frequency Ordered Stop   12/03/18 1000  rivaroxaban (XARELTO) tablet 10 mg     10 mg Oral Daily 12/02/18 0730     12/03/18 0000  rivaroxaban (XARELTO) 10 MG TABS tablet     10 mg Oral Daily 12/02/18 0735      Epidural / Intrathecal (From admission, onward)   Start     Dose/Rate Route Frequency Ordered Stop   11/30/18 1015  ropivacaine (PF) 2 mg/mL (0.2%) (NAROPIN) injection     10 mL/hr 10 mL/hr  Epidural Continuous 11/30/18 1012         Plan: Catheter removed/tip intact at surgeon's request  Nolon Nations

## 2018-12-02 NOTE — Progress Notes (Signed)
   Subjective: 2 Days Post-Op Procedure(s) (LRB): TOTAL KNEE BILATERAL (Bilateral) Patient reports pain as mild.   Patient seen in rounds with Dr. Wynelle Link. Patient is well, and has had no acute complaints or problems other than mild discomfort in both knees, specifically the left. Denies chest pain, SOB, or calf pain. Foley catheter in place. No issues overnight. Plan is to go Rehab after hospital stay.  Objective: Vital signs in last 24 hours: Temp:  [98.2 F (36.8 C)-98.6 F (37 C)] 98.2 F (36.8 C) (07/17 0507) Pulse Rate:  [79-102] 79 (07/17 0507) Resp:  [16] 16 (07/17 0507) BP: (124-156)/(64-83) 124/81 (07/17 0507) SpO2:  [97 %-100 %] 97 % (07/17 0507)  Intake/Output from previous day:  Intake/Output Summary (Last 24 hours) at 12/02/2018 0719 Last data filed at 12/02/2018 0514 Gross per 24 hour  Intake 1657.43 ml  Output 2850 ml  Net -1192.57 ml    Labs: Recent Labs    12/01/18 0245 12/02/18 0303  HGB 9.9* 9.0*   Recent Labs    12/01/18 0245 12/02/18 0303  WBC 13.3* 18.3*  RBC 3.01* 2.75*  HCT 30.5* 28.5*  PLT 258 250   Recent Labs    12/01/18 0245 12/02/18 0303  NA 138 139  K 3.4* 3.8  CL 104 105  CO2 22 24  BUN 11 13  CREATININE 0.53 0.51  GLUCOSE 138* 137*  CALCIUM 8.5* 8.5*   Exam: General - Patient is Alert and Oriented Extremity - Neurologically intact Neurovascular intact Sensation intact distally Dorsiflexion/Plantar flexion intact Dressing/Incision - both incisions clean, dry, with no drainage Motor Function - intact, moving foot and toes well on exam.   Past Medical History:  Diagnosis Date  . Arthritis    bi lat knees  . Hypertension     Assessment/Plan: 2 Days Post-Op Procedure(s) (LRB): TOTAL KNEE BILATERAL (Bilateral) Principal Problem:   OA (osteoarthritis) of knee  Estimated body mass index is 34.06 kg/m as calculated from the following:   Height as of this encounter: 5\' 2"  (1.575 m).   Weight as of this encounter:  84.5 kg. Up with therapy  DVT Prophylaxis - Aspirin 325 mg BID today, will begin Xarelto 10 mg tomorrow AM Weight-bearing as tolerated  Epidural to be removed today, anticipate increase in pain. Foley catheter to be removed at least 6 hours following epidural discontinuation.  Per rehab admission coordinator, earliest transfer to CIR would be Monday.  Theresa Duty, PA-C Orthopedic Surgery 12/02/2018, 7:19 AM

## 2018-12-02 NOTE — Progress Notes (Signed)
Inpatient Rehabilitation-Admissions Coordinator   [facility] has initiated insurance authorization process for possible admit. Anticipate insurance determination by Monday.  Please call if questions.   Jhonnie Garner, OTR/L  Rehab Admissions Coordinator  [phone] 12/02/2018 12:57 PM '

## 2018-12-02 NOTE — Addendum Note (Signed)
Addendum  created 12/02/18 1555 by Nolon Nations, MD   Order list changed

## 2018-12-02 NOTE — Addendum Note (Signed)
Addendum  created 12/02/18 1509 by Nolon Nations, MD   Clinical Note Signed

## 2018-12-02 NOTE — Progress Notes (Signed)
Physical Therapy Treatment Patient Details Name: Joanna Barber [place] MRN: [id number] DOB: 1965/11/28 Today's Date: 12/02/2018    History of Present Illness Pt s/p Bil TKR    PT Comments    Pt continues very motivated and progressing steadily with mobility - epidural still in place.  Follow Up Recommendations  CIR     Equipment Recommendations  None recommended by PT    Recommendations for Other Services       Precautions / Restrictions Precautions Precautions: Knee;Fall Required Braces or Orthoses: Knee Immobilizer - Right;Knee Immobilizer - Left Knee Immobilizer - Right: Discontinue once straight leg raise with < 10 degree lag Knee Immobilizer - Left: Discontinue once straight leg raise with < 10 degree lag Restrictions Weight Bearing Restrictions: No Other Position/Activity Restrictions: WBAT    Mobility  Bed Mobility Overal bed mobility: Needs Assistance Bed Mobility: Sit to Supine     Supine to sit: Min assist;+2 for safety/equipment;HOB elevated Sit to supine: Min assist;Mod assist;+2 for physical assistance   General bed mobility comments: cues for sequence with assist to manage LEs and steady trunk  Transfers Overall transfer level: Needs assistance Equipment used: Rolling walker (2 wheeled) Transfers: Sit to/from Stand Sit to Stand: Mod assist;+2 physical assistance;+2 safety/equipment         General transfer comment: cues for sequence to walk LEs backward as UEs elevate from chair  Ambulation/Gait Ambulation/Gait assistance: Min assist;+2 physical assistance;+2 safety/equipment Gait Distance (Feet): 22 Feet Assistive device: Rolling walker (2 wheeled) Gait Pattern/deviations: Step-to pattern;Shuffle;Trunk flexed;Decreased step length - right;Decreased step length - left Gait velocity: decr   General Gait Details: cues for sequence, posture and position from RW; physical assist for balance/support and RW management   Stairs              Wheelchair Mobility    Modified Rankin (Stroke Patients Only)       Balance Overall balance assessment: Needs assistance Sitting-balance support: Bilateral upper extremity supported;Feet supported Sitting balance-[name] Scale: Fair     Standing balance support: Bilateral upper extremity supported Standing balance-[name] Scale: Poor                              Cognition Arousal/Alertness: Awake/alert Behavior During Therapy: WFL for tasks assessed/performed Overall Cognitive Status: Within Functional Limits for tasks assessed                                        Exercises      General Comments        Pertinent Vitals/Pain Pain Assessment: 0-10 Pain Score: 6  Pain Location: bil knees Pain Descriptors / Indicators: Aching;Sore Pain Intervention(s): Limited activity within patient's tolerance;Monitored during session;Premedicated before session;Ice applied    Home Living Family/patient expects to be discharged to:: Inpatient rehab Living Arrangements: Spouse/significant other                  Prior Function Level of Independence: Independent          PT Goals (current goals can now be found in the care plan section) Acute Rehab PT Goals Patient Stated Goal: Regain IND PT Goal Formulation: With patient Time For Goal Achievement: 12/15/18 Potential to Achieve Goals: Good Progress towards PT goals: Progressing toward goals    Frequency    7X/week      PT Plan Current plan remains appropriate  Co-evaluation PT/OT/SLP Co-Evaluation/Treatment: Yes Reason for Co-Treatment: For patient/therapist safety PT goals addressed during session: Mobility/safety with mobility OT goals addressed during session: ADL's and self-care      AM-PAC PT "6 Clicks" Mobility   Outcome Measure  Help needed turning from your back to your side while in a flat bed without using bedrails?: A Lot Help needed moving from lying on your  back to sitting on the side of a flat bed without using bedrails?: A Lot Help needed moving to and from a bed to a chair (including a wheelchair)?: A Lot Help needed standing up from a chair using your arms (e.g., wheelchair or bedside chair)?: A Lot Help needed to walk in hospital room?: A Lot Help needed climbing 3-5 steps with a railing? : Total 6 Click Score: 11    End of Session Equipment Utilized During Treatment: Gait belt;Right knee immobilizer;Left knee immobilizer Activity Tolerance: Patient tolerated treatment well;Patient limited by fatigue;Patient limited by pain Patient left: in bed;with call bell/phone within reach Nurse Communication: Mobility status PT Visit Diagnosis: Difficulty in walking, not elsewhere classified (R26.2)     Time: 2585-2778 PT Time Calculation (min) (ACUTE ONLY): 19 min  Charges:  $Gait Training: 8-22 mins                     [place] Pager [phone] Office [phone]    Hoke Baer 12/02/2018, 2:47 PM

## 2018-12-02 NOTE — Addendum Note (Signed)
Addendum  created 12/02/18 1511 by Nolon Nations, MD   Intraprocedure LDAs edited, LDA properties accepted

## 2018-12-03 LAB — CBC
HCT: 28.1 % — ABNORMAL LOW (ref 36.0–46.0)
Hemoglobin: 8.8 g/dL — ABNORMAL LOW (ref 12.0–15.0)
MCH: 32.4 pg (ref 26.0–34.0)
MCHC: 31.3 g/dL (ref 30.0–36.0)
MCV: 103.3 fL — ABNORMAL HIGH (ref 80.0–100.0)
Platelets: 262 10*3/uL (ref 150–400)
RBC: 2.72 MIL/uL — ABNORMAL LOW (ref 3.87–5.11)
RDW: 12.5 % (ref 11.5–15.5)
WBC: 13.1 10*3/uL — ABNORMAL HIGH (ref 4.0–10.5)
nRBC: 0 % (ref 0.0–0.2)

## 2018-12-03 LAB — BASIC METABOLIC PANEL
Anion gap: 10 (ref 5–15)
BUN: 14 mg/dL (ref 6–20)
CO2: 25 mmol/L (ref 22–32)
Calcium: 8.3 mg/dL — ABNORMAL LOW (ref 8.9–10.3)
Chloride: 101 mmol/L (ref 98–111)
Creatinine, Ser: 0.48 mg/dL (ref 0.44–1.00)
GFR calc Af Amer: >60 mL/min (ref >60)
GFR calc non Af Amer: >60 mL/min (ref >60)
Glucose, Bld: 137 mg/dL — ABNORMAL HIGH (ref 70–99)
Potassium: 3.6 mmol/L (ref 3.5–5.1)
Sodium: 136 mmol/L (ref 135–145)

## 2018-12-03 NOTE — Progress Notes (Signed)
Physical Therapy Treatment Patient Details Name: Joanna Barber MRN: [id number] DOB: [birth year] Today's Date: 12/03/2018    History of Present Illness Pt s/p Bil TKR    PT Comments    Pt continues motivated but requiring increased time and with decreased activity tolerance 2* elevated pain level since epidural dc.  Follow Up Recommendations  CIR     Equipment Recommendations  None recommended by PT    Recommendations for Other Services       Precautions / Restrictions Precautions Precautions: Knee;Fall Required Braces or Orthoses: Knee Immobilizer - Right;Knee Immobilizer - Left Knee Immobilizer - Right: Discontinue once straight leg raise with < 10 degree lag Knee Immobilizer - Left: Discontinue once straight leg raise with < 10 degree lag Restrictions Weight Bearing Restrictions: No Other Position/Activity Restrictions: WBAT    Mobility  Bed Mobility Overal bed mobility: Needs Assistance Bed Mobility: Supine to Sit     Supine to sit: Min assist;+2 for safety/equipment;HOB elevated     General bed mobility comments: cues for sequence with assist to manage LEs and steady trunk  Transfers Overall transfer level: Needs assistance Equipment used: Rolling walker (2 wheeled) Transfers: Sit to/from Stand Sit to Stand: Mod assist;+2 physical assistance;+2 safety/equipment Stand pivot transfers: Min assist;+2 physical assistance;+2 safety/equipment       General transfer comment: cues for sequence to walk LEs backward as UEs elevate from chair  Ambulation/Gait Ambulation/Gait assistance: Min assist;+2 physical assistance;+2 safety/equipment Gait Distance (Feet): 11 Feet Assistive device: Rolling walker (2 wheeled) Gait Pattern/deviations: Step-to pattern;Shuffle;Trunk flexed;Decreased step length - right;Decreased step length - left Gait velocity: decr   General Gait Details: cues for sequence, posture and position from RW; physical assist for balance/support  and RW management   Stairs             Wheelchair Mobility    Modified Rankin (Stroke Patients Only)       Balance Overall balance assessment: Needs assistance Sitting-balance support: Bilateral upper extremity supported;Feet supported Sitting balance-[name] Scale: Fair     Standing balance support: Bilateral upper extremity supported Standing balance-[name] Scale: Poor                              Cognition Arousal/Alertness: Awake/alert Behavior During Therapy: WFL for tasks assessed/performed Overall Cognitive Status: Within Functional Limits for tasks assessed                                        Exercises Total Joint Exercises Ankle Circles/Pumps: AROM;Both;15 reps;Supine Quad Sets: AROM;Both;Supine;5 reps Heel Slides: AAROM;Both;Supine;5 reps Straight Leg Raises: AAROM;Both;Supine;5 reps Goniometric ROM: AAROM R knee -8 - 35; L knee - 8 - 30    General Comments        Pertinent Vitals/Pain Pain Assessment: 0-10 Pain Score: 7  Pain Location: bil knees Pain Descriptors / Indicators: Aching;Sore Pain Intervention(s): Limited activity within patient's tolerance;Monitored during session;Premedicated before session;Ice applied    Home Living                      Prior Function            PT Goals (current goals can now be found in the care plan section) Acute Rehab PT Goals Patient Stated Goal: Regain IND PT Goal Formulation: With patient Time For Goal Achievement: 12/15/18 Potential to Achieve Goals: Good  Progress towards PT goals: Progressing toward goals    Frequency    7X/week      PT Plan Current plan remains appropriate    Co-evaluation              AM-PAC PT "6 Clicks" Mobility   Outcome Measure  Help needed turning from your back to your side while in a flat bed without using bedrails?: A Lot Help needed moving from lying on your back to sitting on the side of a flat bed without  using bedrails?: A Lot Help needed moving to and from a bed to a chair (including a wheelchair)?: A Lot Help needed standing up from a chair using your arms (e.g., wheelchair or bedside chair)?: A Lot Help needed to walk in hospital room?: A Lot Help needed climbing 3-5 steps with a railing? : Total 6 Click Score: 11    End of Session Equipment Utilized During Treatment: Gait belt;Right knee immobilizer;Left knee immobilizer Activity Tolerance: Patient tolerated treatment well;Patient limited by fatigue;Patient limited by pain Patient left: in chair;with call bell/phone within reach;with chair alarm set Nurse Communication: Mobility status PT Visit Diagnosis: Difficulty in walking, not elsewhere classified (R26.2)     Time: 6144-3154 PT Time Calculation (min) (ACUTE ONLY): 44 min  Charges:  $Gait Training: 8-22 mins $Therapeutic Exercise: 8-22 mins $Therapeutic Activity: 8-22 mins                     [place] Pager [phone] Office [phone]    Joanna Barber 12/03/2018, 12:53 PM

## 2018-12-03 NOTE — Progress Notes (Signed)
   Subjective: 3 Days Post-Op Procedure(s) (LRB): TOTAL KNEE BILATERAL (Bilateral)  Pt states pain is still moderate to severe  She also feels tired due to the energy being used with simply getting up Denies any numbness or tingling  Otherwise doing fair Patient reports pain as severe.  Objective:   VITALS:   Vitals:   12/02/18 2202 12/03/18 0607  BP: (!) 145/82 130/79  Pulse: 77 80  Resp: 20 20  Temp: 98.5 F (36.9 C) 98.4 F (36.9 C)  SpO2: 97% 98%    Bilateral knee incisions healing well No drainage or erythema nv intact distally No rashes Mild edema distally bilaterally  LABS Recent Labs    12/01/18 0245 12/02/18 0303 12/03/18 0422  HGB 9.9* 9.0* 8.8*  HCT 30.5* 28.5* 28.1*  WBC 13.3* 18.3* 13.1*  PLT 258 250 262    Recent Labs    12/01/18 0245 12/02/18 0303 12/03/18 0422  NA 138 139 136  K 3.4* 3.8 3.6  BUN 11 13 14   CREATININE 0.53 0.51 0.48  GLUCOSE 138* 137* 137*     Assessment/Plan: 3 Days Post-Op Procedure(s) (LRB): TOTAL KNEE BILATERAL (Bilateral) Continue PT/OT Possible CIR placement on Monday Pain management Pulmonary toilet    Brad Luna [place], MPAS [facility] is now [facility]  [place] [address], [place], [place], [place] [address] Phone: [phone] [email or web address] Facebook  [employer]

## 2018-12-03 NOTE — Progress Notes (Addendum)
Physical Therapy Treatment Patient Details Name: Joanna Barber MRN: [id number] DOB: [birth year] Today's Date: 12/03/2018    History of Present Illness Pt s/p Bil TKR    PT Comments    Pt motivated and with improved pain control allowing increased activity tolerance this pm   Follow Up Recommendations  CIR     Equipment Recommendations  None recommended by PT    Recommendations for Other Services       Precautions / Restrictions Precautions Precautions: Knee;Fall Required Braces or Orthoses: Knee Immobilizer - Right;Knee Immobilizer - Left Knee Immobilizer - Right: Discontinue once straight leg raise with < 10 degree lag Knee Immobilizer - Left: Discontinue once straight leg raise with < 10 degree lag Restrictions Weight Bearing Restrictions: No Other Position/Activity Restrictions: WBAT    Mobility  Bed Mobility Overal bed mobility: Needs Assistance Bed Mobility: Sit to Supine       Sit to supine: Min assist;Mod assist;+2 for physical assistance   General bed mobility comments: cues for sequence with assist to manage LEs and steady trunk  Transfers Overall transfer level: Needs assistance Equipment used: Rolling walker (2 wheeled) Transfers: Sit to/from Stand Sit to Stand: Min assist;Mod assist;+2 physical assistance;+2 safety/equipment         General transfer comment: cues for sequence to walk LEs backward as UEs elevate from chair  Ambulation/Gait Ambulation/Gait assistance: Min assist;+2 physical assistance;+2 safety/equipment Gait Distance (Feet): 22 Feet Assistive device: Rolling walker (2 wheeled) Gait Pattern/deviations: Step-to pattern;Shuffle;Trunk flexed;Decreased step length - right;Decreased step length - left Gait velocity: decr   General Gait Details: cues for sequence, posture and position from RW; physical assist for balance/support and RW management   Stairs             Wheelchair Mobility    Modified Rankin (Stroke  Patients Only)       Balance Overall balance assessment: Needs assistance Sitting-balance support: Bilateral upper extremity supported;Feet supported Sitting balance-[name] Scale: Fair     Standing balance support: Bilateral upper extremity supported Standing balance-[name] Scale: Fair                              Cognition Arousal/Alertness: Awake/alert Behavior During Therapy: WFL for tasks assessed/performed Overall Cognitive Status: Within Functional Limits for tasks assessed                                        Exercises      General Comments        Pertinent Vitals/Pain Pain Assessment: 0-10 Pain Score: 7  Pain Location: bil knees Pain Descriptors / Indicators: Aching;Sore Pain Intervention(s): Limited activity within patient's tolerance;Monitored during session;Premedicated before session;Ice applied    Home Living                      Prior Function            PT Goals (current goals can now be found in the care plan section) Acute Rehab PT Goals Patient Stated Goal: Regain IND PT Goal Formulation: With patient Time For Goal Achievement: 12/15/18 Potential to Achieve Goals: Good Progress towards PT goals: Progressing toward goals    Frequency    7X/week      PT Plan Current plan remains appropriate    Co-evaluation  AM-PAC PT "6 Clicks" Mobility   Outcome Measure  Help needed turning from your back to your side while in a flat bed without using bedrails?: A Lot Help needed moving from lying on your back to sitting on the side of a flat bed without using bedrails?: A Lot Help needed moving to and from a bed to a chair (including a wheelchair)?: A Lot Help needed standing up from a chair using your arms (e.g., wheelchair or bedside chair)?: A Lot Help needed to walk in hospital room?: A Lot Help needed climbing 3-5 steps with a railing? : Total 6 Click Score: 11    End of Session  Equipment Utilized During Treatment: Gait belt;Right knee immobilizer;Left knee immobilizer Activity Tolerance: Patient tolerated treatment well;Patient limited by fatigue;Patient limited by pain Patient left: in bed;with call bell/phone within reach;with bed alarm set Nurse Communication: Mobility status PT Visit Diagnosis: Difficulty in walking, not elsewhere classified (R26.2)     Time: 6160-7371 PT Time Calculation (min) (ACUTE ONLY): 24 min  Charges:  $Gait Training: 23-37 mins                      [place] Pager [phone] Office [phone]    Lyndsy Gilberto 12/03/2018, 4:51 PM

## 2018-12-04 LAB — CBC
HCT: 26.4 % — ABNORMAL LOW (ref 36.0–46.0)
Hemoglobin: 8.1 g/dL — ABNORMAL LOW (ref 12.0–15.0)
MCH: 31.9 pg (ref 26.0–34.0)
MCHC: 30.7 g/dL (ref 30.0–36.0)
MCV: 103.9 fL — ABNORMAL HIGH (ref 80.0–100.0)
Platelets: 264 10*3/uL (ref 150–400)
RBC: 2.54 MIL/uL — ABNORMAL LOW (ref 3.87–5.11)
RDW: 12 % (ref 11.5–15.5)
WBC: 11.8 10*3/uL — ABNORMAL HIGH (ref 4.0–10.5)
nRBC: 0.2 % (ref 0.0–0.2)

## 2018-12-04 NOTE — Progress Notes (Signed)
Physical Therapy Treatment Patient Details Name: Joanna Barber MRN: [id number] DOB: 1965-11-16 Today's Date: 12/04/2018    History of Present Illness Pt s/p Bil TKR    PT Comments    Pt continues motivated and with improved pain control this am allowing continued progress with mobility and ROM.strengthening.  This date L KI dc with performance of 10 IND SLR, pt ambulated increased distance and pt steadily increasing ROM bil knees.   Follow Up Recommendations  CIR     Equipment Recommendations  None recommended by PT    Recommendations for Other Services       Precautions / Restrictions Precautions Precautions: Knee;Fall Required Braces or Orthoses: Knee Immobilizer - Right;Knee Immobilizer - Left Knee Immobilizer - Right: Discontinue once straight leg raise with < 10 degree lag Knee Immobilizer - Left: Discontinue once straight leg raise with < 10 degree lag(dc KI on L LE this am with performance of IND SLR) Restrictions Weight Bearing Restrictions: No Other Position/Activity Restrictions: WBAT    Mobility  Bed Mobility Overal bed mobility: Needs Assistance Bed Mobility: Supine to Sit     Supine to sit: Min assist     General bed mobility comments: min assist LE control; pt managed trunk to upright without assist  Transfers Overall transfer level: Needs assistance Equipment used: Rolling walker (2 wheeled) Transfers: Sit to/from Stand Sit to Stand: Min assist;Mod assist;From elevated surface         General transfer comment: Cues for use of UEs but pt self cueing to walk LEs fwd and bkwd  Ambulation/Gait Ambulation/Gait assistance: Min assist;+2 safety/equipment Gait Distance (Feet): 22 Feet(twice) Assistive device: Rolling walker (2 wheeled) Gait Pattern/deviations: Step-to pattern;Shuffle;Trunk flexed;Decreased step length - right;Decreased step length - left Gait velocity: decr   General Gait Details: min cues for sequence, posture and position from  RW; physical assist for balance/support and RW management   Stairs             Wheelchair Mobility    Modified Rankin (Stroke Patients Only)       Balance Overall balance assessment: Needs assistance Sitting-balance support: Bilateral upper extremity supported;Feet supported Sitting balance-[name] Scale: Fair     Standing balance support: Bilateral upper extremity supported Standing balance-[name] Scale: Fair                              Cognition Arousal/Alertness: Awake/alert Behavior During Therapy: WFL for tasks assessed/performed Overall Cognitive Status: Within Functional Limits for tasks assessed                                        Exercises Total Joint Exercises Ankle Circles/Pumps: AROM;Both;15 reps;Supine Quad Sets: AROM;Both;Supine;10 reps Heel Slides: AAROM;Both;Supine;15 reps Straight Leg Raises: Both;Supine;10 reps;AAROM;AROM Goniometric ROM: AAROM R knee -8-35; L knee -8 - 40    General Comments        Pertinent Vitals/Pain Pain Assessment: 0-10 Pain Score: 6  Pain Location: bil knees Pain Descriptors / Indicators: Aching;Sore;Burning Pain Intervention(s): Limited activity within patient's tolerance;Monitored during session;Premedicated before session;Ice applied    Home Living                      Prior Function            PT Goals (current goals can now be found in the care plan section) Acute  Rehab PT Goals Patient Stated Goal: Regain IND PT Goal Formulation: With patient Time For Goal Achievement: 12/15/18 Potential to Achieve Goals: Good Progress towards PT goals: Progressing toward goals    Frequency    7X/week      PT Plan Current plan remains appropriate    Co-evaluation              AM-PAC PT "6 Clicks" Mobility   Outcome Measure  Help needed turning from your back to your side while in a flat bed without using bedrails?: A Lot Help needed moving from lying on your  back to sitting on the side of a flat bed without using bedrails?: A Little Help needed moving to and from a bed to a chair (including a wheelchair)?: A Lot Help needed standing up from a chair using your arms (e.g., wheelchair or bedside chair)?: A Lot Help needed to walk in hospital room?: A Little Help needed climbing 3-5 steps with a railing? : Total 6 Click Score: 13    End of Session Equipment Utilized During Treatment: Gait belt;Right knee immobilizer Activity Tolerance: Patient tolerated treatment well;Patient limited by fatigue;Patient limited by pain Patient left: in chair;with call bell/phone within reach;with chair alarm set Nurse Communication: Mobility status PT Visit Diagnosis: Difficulty in walking, not elsewhere classified (R26.2)     Time: 3818-2993 PT Time Calculation (min) (ACUTE ONLY): 34 min  Charges:  $Gait Training: 8-22 mins $Therapeutic Exercise: 8-22 mins                     Debe Coder PT Acute Rehabilitation Services Pager [phone] Office [phone]    Joanna Barber 12/04/2018, 12:07 PM

## 2018-12-04 NOTE — Progress Notes (Signed)
Physical Therapy Treatment Patient Details Name: Joanna Barber MRN: [id number] DOB: [birth year] Today's Date: 12/04/2018    History of Present Illness Pt s/p Bil TKR    PT Comments    Pt continues motivated and progressing each session with mobility.  Ambulated increased distance this pm and with decreased assist and cues for performance of all mobility tasks.   Follow Up Recommendations  CIR     Equipment Recommendations  None recommended by PT    Recommendations for Other Services       Precautions / Restrictions Precautions Precautions: Knee;Fall Required Braces or Orthoses: Knee Immobilizer - Right;Knee Immobilizer - Left Knee Immobilizer - Right: Discontinue once straight leg raise with < 10 degree lag Knee Immobilizer - Left: Discontinue once straight leg raise with < 10 degree lag Restrictions Weight Bearing Restrictions: No Other Position/Activity Restrictions: WBAT    Mobility  Bed Mobility Overal bed mobility: Needs Assistance Bed Mobility: Sit to Supine       Sit to supine: Min assist;Mod assist   General bed mobility comments: assist to manage LEs  Transfers Overall transfer level: Needs assistance Equipment used: Rolling walker (2 wheeled) Transfers: Sit to/from Stand Sit to Stand: Min assist;Mod assist         General transfer comment: pt self cueing for use of UEs and LE management  Ambulation/Gait Ambulation/Gait assistance: Min assist Gait Distance (Feet): 35 Feet(twice) Assistive device: Rolling walker (2 wheeled) Gait Pattern/deviations: Step-to pattern;Shuffle;Trunk flexed;Decreased step length - right;Decreased step length - left Gait velocity: decr   General Gait Details: Increased time with min cues for sequence, posture and position from RW; physical assist for balance/support and RW management   Stairs             Wheelchair Mobility    Modified Rankin (Stroke Patients Only)       Balance Overall balance  assessment: Needs assistance Sitting-balance support: Bilateral upper extremity supported;Feet supported Sitting balance-[name] Scale: Good     Standing balance support: Bilateral upper extremity supported Standing balance-[name] Scale: Fair                              Cognition Arousal/Alertness: Awake/alert Behavior During Therapy: WFL for tasks assessed/performed Overall Cognitive Status: Within Functional Limits for tasks assessed                                        Exercises      General Comments        Pertinent Vitals/Pain Pain Assessment: 0-10 Pain Score: 5  Pain Location: bil knees Pain Descriptors / Indicators: Aching;Sore;Burning Pain Intervention(s): Monitored during session;Premedicated before session;Limited activity within patient's tolerance;Ice applied    Home Living                      Prior Function            PT Goals (current goals can now be found in the care plan section) Acute Rehab PT Goals Patient Stated Goal: Regain IND PT Goal Formulation: With patient Time For Goal Achievement: 12/15/18 Potential to Achieve Goals: Good Progress towards PT goals: Progressing toward goals    Frequency    7X/week      PT Plan Current plan remains appropriate    Co-evaluation  AM-PAC PT "6 Clicks" Mobility   Outcome Measure  Help needed turning from your back to your side while in a flat bed without using bedrails?: A Lot Help needed moving from lying on your back to sitting on the side of a flat bed without using bedrails?: A Little Help needed moving to and from a bed to a chair (including a wheelchair)?: A Little Help needed standing up from a chair using your arms (e.g., wheelchair or bedside chair)?: A Little Help needed to walk in hospital room?: A Little Help needed climbing 3-5 steps with a railing? : Total 6 Click Score: 15    End of Session Equipment Utilized During  Treatment: Gait belt;Right knee immobilizer Activity Tolerance: Patient tolerated treatment well;Patient limited by fatigue Patient left: in bed;with call bell/phone within reach;with bed alarm set Nurse Communication: Mobility status PT Visit Diagnosis: Difficulty in walking, not elsewhere classified (R26.2)     Time: [id number] PT Time Calculation (min) (ACUTE ONLY): 26 min  Charges:  $Gait Training: 23-37 mins                     Mauro KaufmannHunter Artem Bunte PT Acute Rehabilitation Services Pager [phone] Office [phone]    Trygve Thal 12/04/2018, 3:09 PM

## 2018-12-04 NOTE — Progress Notes (Signed)
Joanna Barber  MRN: [id number] DOB/Age: 12-11-1965 53 y.o. [facility] Procedure: Procedure(s) (LRB): TOTAL KNEE BILATERAL (Bilateral)     Subjective: Pain under much better control today. Passing flatus but no BM yet  Vital Signs Temp:  [98.4 F (36.9 C)-99.4 F (37.4 C)] 99.4 F (37.4 C) (07/19 0429) Pulse Rate:  [82-98] 98 (07/19 0429) Resp:  [16-18] 18 (07/19 0429) BP: (134-147)/(72-74) 134/72 (07/19 0429) SpO2:  [95 %-100 %] 95 % (07/19 0429)  Lab Results Recent Labs    12/03/18 0422 12/04/18 0341  WBC 13.1* 11.8*  HGB 8.8* 8.1*  HCT 28.1* 26.4*  PLT 262 264   BMET Recent Labs    12/02/18 0303 12/03/18 0422  NA 139 136  K 3.8 3.6  CL 105 101  CO2 24 25  GLUCOSE 137* 137*  BUN 13 14  CREATININE 0.51 0.48  CALCIUM 8.5* 8.3*   INR  Date Value Ref Range Status  11/28/2018 1.0 0.8 - 1.2 Final    Comment:    (NOTE) INR goal varies based on device and disease states. Performed at [facility], [place] [address] [place], [place] [address]      Exam Bilat knees show incisions to be clean and dry Mod swelling  NVI        Plan Cont PT/OT and plan DC to CIR tomorrow Discussed importance of bowel care to prevent ileus, currently on appropriate regimen   Jenetta Loges PA-C  12/04/2018, 9:11 AM Contact # [phone]

## 2018-12-04 NOTE — Plan of Care (Signed)
  Problem: Education: Goal: Knowledge of the prescribed therapeutic regimen will improve Outcome: Progressing   Problem: Activity: Goal: Ability to avoid complications of mobility impairment will improve Outcome: Progressing Goal: Range of joint motion will improve Outcome: Progressing   Problem: Pain Management: Goal: Pain level will decrease with appropriate interventions Outcome: Progressing   Problem: Education: Goal: Knowledge of General Education information will improve Description: Including pain rating scale, medication(s)/side effects and non-pharmacologic comfort measures Outcome: Progressing   Problem: Clinical Measurements: Goal: Ability to maintain clinical measurements within normal limits will improve Outcome: Progressing Goal: Will remain free from infection Outcome: Progressing Goal: Respiratory complications will improve Outcome: Progressing Goal: Cardiovascular complication will be avoided Outcome: Progressing   Problem: Coping: Goal: Level of anxiety will decrease Outcome: Progressing   Problem: Safety: Goal: Ability to remain free from injury will improve Outcome: Progressing

## 2018-12-05 NOTE — Progress Notes (Signed)
Physical Therapy Treatment Patient Details Name: Joanna Barber MRN: [id number] DOB: 1965/06/23 Today's Date: 12/05/2018    History of Present Illness Pt s/p Bil TKR    PT Comments    POD # 5 am session Pt progressing slowly.  Assisted with transfer out of recliner required much effort.  General transfer comment: pt self cueing for use of UEs and LE management.  Very limited L knee flex and wearing right Ki makes it very difficult for pt to self perform. Assisted with amb.  General Gait Details: Increased time with min cues for sequence, posture and position from RW; physical assist for balance/support and RW management.  esp unsteady with turns and backward gait.  Excess WBing thru B UE's on walker. limited distance due to pain and effort. Assisted back to bed.  Required assist to support B LE and required increased time to scoot and weightshift.  Performed some TE's.  R knee AAROM 5 - 60 degrees and L knee AAROM 5 - 70 degrees.   Pt will need CIR prior to safely D/C back home.    Follow Up Recommendations  CIR     Equipment Recommendations  None recommended by PT    Recommendations for Other Services       Precautions / Restrictions Precautions Precautions: Knee;Fall Required Braces or Orthoses: Knee Immobilizer - Right Knee Immobilizer - Right: Discontinue once straight leg raise with < 10 degree lag Knee Immobilizer - Left: Discontinue once straight leg raise with < 10 degree lag Restrictions Weight Bearing Restrictions: No Other Position/Activity Restrictions: WBAT    Mobility  Bed Mobility Overal bed mobility: Needs Assistance Bed Mobility: Sit to Supine     Supine to sit: Min assist Sit to supine: Mod assist   General bed mobility comments: assist to manage LEs  Transfers Overall transfer level: Needs assistance Equipment used: Rolling walker (2 wheeled) Transfers: Sit to/from Stand Sit to Stand: Min assist;Mod assist Stand pivot transfers: Mod assist        General transfer comment: pt self cueing for use of UEs and LE management.  Very limited L knee flex and wearing right Ki makes it very difficult for pt to self perform.  Ambulation/Gait Ambulation/Gait assistance: Min assist Gait Distance (Feet): 34 Feet Assistive device: Rolling walker (2 wheeled) Gait Pattern/deviations: Step-to pattern;Shuffle;Trunk flexed;Decreased step length - right;Decreased step length - left Gait velocity: decr   General Gait Details: Increased time with min cues for sequence, posture and position from RW; physical assist for balance/support and RW management.  esp unsteady with turns and backward gait.  Excess WBing thru B UE's on walker. limited distance due to pain and effort.   Stairs             Wheelchair Mobility    Modified Rankin (Stroke Patients Only)       Balance Overall balance assessment: Needs assistance Sitting-balance support: Bilateral upper extremity supported;Feet supported Sitting balance-[name] Scale: Good     Standing balance support: Bilateral upper extremity supported Standing balance-[name] Scale: Fair                              Cognition Arousal/Alertness: Awake/alert Behavior During Therapy: WFL for tasks assessed/performed Overall Cognitive Status: Within Functional Limits for tasks assessed  Exercises   10 reps B LE AAROM HS using gait belt to self assist   General Comments        Pertinent Vitals/Pain Pain Assessment: 0-10 Pain Score: 7  Pain Location: bil knees   R>L Pain Descriptors / Indicators: Sore;Discomfort;Tightness Pain Intervention(s): Monitored during session    Home Living                      Prior Function            PT Goals (current goals can now be found in the care plan section) Progress towards PT goals: Progressing toward goals    Frequency    7X/week      PT Plan Current plan remains  appropriate    Co-evaluation              AM-PAC PT "6 Clicks" Mobility   Outcome Measure  Help needed turning from your back to your side while in a flat bed without using bedrails?: A Lot Help needed moving from lying on your back to sitting on the side of a flat bed without using bedrails?: A Lot Help needed moving to and from a bed to a chair (including a wheelchair)?: A Lot Help needed standing up from a chair using your arms (e.g., wheelchair or bedside chair)?: A Lot Help needed to walk in hospital room?: A Lot Help needed climbing 3-5 steps with a railing? : Total 6 Click Score: 11    End of Session Equipment Utilized During Treatment: Gait belt Activity Tolerance: Patient limited by pain Patient left: in bed;with call bell/phone within reach;with bed alarm set Nurse Communication: Mobility status PT Visit Diagnosis: Difficulty in walking, not elsewhere classified (R26.2)     Time: [id number] PT Time Calculation (min) (ACUTE ONLY): 26 min  Charges:  $Gait Training: 8-22 mins $Therapeutic Exercise: 8-22 mins                     Felecia ShellingLori Madgie Dhaliwal  PTA Acute  Rehabilitation Services Pager      [phone] Office      [phone]

## 2018-12-05 NOTE — Progress Notes (Addendum)
Inpatient Rehabilitation-Admissions Coordinator   Pt's insurance has issued an initial denial for her CIR request. They have offered a peer to peer conference. Spoke with Theresa Duty, PA who believes Dr. Wynelle Link would want to do peer to peer. Await call back from [facility] for details in order to set up peer to peer.   Jhonnie Garner, OTR/L  Rehab Admissions Coordinator  [phone] 12/05/2018 9:39 AM  Addendum 10:30AM: peer to peer scheduled tentatively with Dr. Wynelle Link between 12-1pm tomorrow.   Will follow up once there is a determination.   Jhonnie Garner, OTR/L  Rehab Admissions Coordinator  [phone] 12/05/2018 10:31 AM

## 2018-12-05 NOTE — Progress Notes (Signed)
   Subjective: 5 Days Post-Op Procedure(s) (LRB): TOTAL KNEE BILATERAL (Bilateral) Patient reports pain as mild.   Patient seen in rounds by Dr. Wynelle Link. Patient is well, and has had no acute complaints or problems other than pain in both knees. Progressing well with physical therapy. Denies chest pain, SOB, or calf pain. Voiding without difficulty and positive flatus. No BM yet.  Plan is to go to [place] after hospital stay.  Objective: Vital signs in last 24 hours: Temp:  [98.1 F (36.7 C)-98.3 F (36.8 C)] 98.2 F (36.8 C) (07/20 0451) Pulse Rate:  [79-85] 79 (07/20 0451) Resp:  [16-17] 16 (07/20 0451) BP: (112-131)/(68-80) 121/80 (07/20 0451) SpO2:  [94 %-97 %] 96 % (07/20 0451)  Intake/Output from previous day:  Intake/Output Summary (Last 24 hours) at 12/05/2018 0709 Last data filed at 12/05/2018 0451 Gross per 24 hour  Intake 2120 ml  Output 3250 ml  Net -1130 ml    Labs: Recent Labs    12/03/18 0422 12/04/18 0341  HGB 8.8* 8.1*   Recent Labs    12/03/18 0422 12/04/18 0341  WBC 13.1* 11.8*  RBC 2.72* 2.54*  HCT 28.1* 26.4*  PLT 262 264   Recent Labs    12/03/18 0422  NA 136  K 3.6  CL 101  CO2 25  BUN 14  CREATININE 0.48  GLUCOSE 137*  CALCIUM 8.3*   Exam: General - Patient is Alert and Oriented Extremity - Neurologically intact Neurovascular intact Sensation intact distally Dorsiflexion/Plantar flexion intact Dressing/Incision - clean, dry, no drainage Motor Function - intact, moving foot and toes well on exam.   Past Medical History:  Diagnosis Date  . Arthritis    bi lat knees  . Hypertension     Assessment/Plan: 5 Days Post-Op Procedure(s) (LRB): TOTAL KNEE BILATERAL (Bilateral) Principal Problem:   OA (osteoarthritis) of knee  Estimated body mass index is 34.06 kg/m as calculated from the following:   Height as of this encounter: 5\' 2"  (1.575 m).   Weight as of this encounter: 84.5 kg. Up with therapy  DVT  Prophylaxis - Xarelto Weight-bearing as tolerated  Progressing well. Hopefully discharge to CIR later today if approved.  Theresa Duty, PA-C Orthopedic Surgery 12/05/2018, 7:09 AM

## 2018-12-05 NOTE — Progress Notes (Signed)
I just spoke with Dr. Pricilla Holm at [facility] in [place], [place] and she has denied inpatient rehab admission for Ms. Castronova. Despite objective criteria which supports that this patient is not safe to go home, Dr Marthenia Rolling feels that she is justified in denying an inpatient rehab admission. She also felt that the high incidence of COVID -19 in our SNFs was not a reason not to consider SNF placement as "COVID is all over the place including her (the physicians) neighborhood". She said we could submit a written appeal which would take several days to review. This would be essentially useless in my opinion as the patient should progress towards discharge home by that point. Ms. Raether will thus stay here as an inpatient until she is safe to discharge home

## 2018-12-05 NOTE — Progress Notes (Signed)
Physical Therapy Treatment Patient Details Name: Joanna Barber MRN: [id number] DOB: 12/30/1965 Today's Date: 12/05/2018    History of Present Illness Pt s/p Bil TKR    PT Comments    POD # 5 pm session Assisted out of bathroom to amb a limited distance in hallway.  General Gait Details: Increased time with min cues for sequence, posture and position from RW; physical assist for balance/support and RW management.  esp unsteady with turns and backward gait.  Excess WBing thru B UE's on walker. limited distance due to pain and effort.  Assisted back to bed and placed in CPM R LE 10 - 55 degrees. Pt will need CIR prior to safe D/C to home.    Follow Up Recommendations  CIR     Equipment Recommendations  None recommended by PT    Recommendations for Other Services       Precautions / Restrictions Precautions Precautions: Knee;Fall Required Braces or Orthoses: Knee Immobilizer - Right Knee Immobilizer - Right: Discontinue once straight leg raise with < 10 degree lag Knee Immobilizer - Left: Discontinue once straight leg raise with < 10 degree lag Restrictions Weight Bearing Restrictions: No Other Position/Activity Restrictions: WBAT    Mobility  Bed Mobility Overal bed mobility: Needs Assistance Bed Mobility: Sit to Supine     Supine to sit: Min assist Sit to supine: Mod assist   General bed mobility comments: assist to manage LEs  Transfers Overall transfer level: Needs assistance Equipment used: Rolling walker (2 wheeled) Transfers: Sit to/from Stand Sit to Stand: Min assist;Mod assist Stand pivot transfers: Mod assist       General transfer comment: pt self cueing for use of UEs and LE management.  Very limited L knee flex and wearing right Ki makes it very difficult for pt to self perform.  Ambulation/Gait Ambulation/Gait assistance: Min assist Gait Distance (Feet): 24 Feet Assistive device: Rolling walker (2 wheeled) Gait Pattern/deviations: Step-to  pattern;Shuffle;Trunk flexed;Decreased step length - right;Decreased step length - left Gait velocity: decr   General Gait Details: Increased time with min cues for sequence, posture and position from RW; physical assist for balance/support and RW management.  esp unsteady with turns and backward gait.  Excess WBing thru B UE's on walker. limited distance due to pain and effort.   Stairs             Wheelchair Mobility    Modified Rankin (Stroke Patients Only)       Balance Overall balance assessment: Needs assistance Sitting-balance support: Bilateral upper extremity supported;Feet supported Sitting balance-[name] Scale: Good     Standing balance support: Bilateral upper extremity supported Standing balance-[name] Scale: Fair                              Cognition Arousal/Alertness: Awake/alert Behavior During Therapy: WFL for tasks assessed/performed Overall Cognitive Status: Within Functional Limits for tasks assessed                                        Exercises      General Comments        Pertinent Vitals/Pain Pain Assessment: 0-10 Pain Score: 7  Pain Location: bil knees   R>L Pain Descriptors / Indicators: Sore;Discomfort;Tightness Pain Intervention(s): Monitored during session    Home Living  Prior Function            PT Goals (current goals can now be found in the care plan section) Progress towards PT goals: Progressing toward goals    Frequency    7X/week      PT Plan Current plan remains appropriate    Co-evaluation              AM-PAC PT "6 Clicks" Mobility   Outcome Measure  Help needed turning from your back to your side while in a flat bed without using bedrails?: A Lot Help needed moving from lying on your back to sitting on the side of a flat bed without using bedrails?: A Lot Help needed moving to and from a bed to a chair (including a wheelchair)?: A  Lot Help needed standing up from a chair using your arms (e.g., wheelchair or bedside chair)?: A Lot Help needed to walk in hospital room?: A Lot Help needed climbing 3-5 steps with a railing? : Total 6 Click Score: 11    End of Session Equipment Utilized During Treatment: Gait belt Activity Tolerance: Patient limited by pain Patient left: in bed;with call bell/phone within reach;with bed alarm set Nurse Communication: Mobility status PT Visit Diagnosis: Difficulty in walking, not elsewhere classified (R26.2)     Time: 1439-1510 PT Time Calculation (min) (ACUTE ONLY): 31 min  Charges:  $Gait Training: 8-22 mins $Therapeutic Activity: 8-22 mins                     Rica Koyanagi  PTA Acute  Rehabilitation Services Pager      [phone] Office      [phone]

## 2018-12-05 NOTE — Progress Notes (Signed)
Occupational Therapy Treatment Patient Details Name: Joanna Barber MRN: [id number] DOB: Jan 09, 1966 Today's Date: 12/05/2018    History of present illness Pt s/p Bil TKR      Follow Up Recommendations  CIR    Equipment Recommendations  3 in 1 bedside commode    Recommendations for Other Services      Precautions / Restrictions Precautions Precautions: Knee;Fall Required Braces or Orthoses: Knee Immobilizer - Right Knee Immobilizer - Right: Discontinue once straight leg raise with < 10 degree lag Knee Immobilizer - Left: Discontinue once straight leg raise with < 10 degree lag Restrictions Weight Bearing Restrictions: No Other Position/Activity Restrictions: WBAT       Mobility Bed Mobility Overal bed mobility: Needs Assistance Bed Mobility: Sit to Supine     Supine to sit: Min assist Sit to supine: Mod assist   General bed mobility comments: assist to manage LEs  Transfers Overall transfer level: Needs assistance Equipment used: Rolling walker (2 wheeled) Transfers: Sit to/from Stand Sit to Stand: Min assist;Mod assist Stand pivot transfers: Mod assist       General transfer comment: pt self cueing for use of UEs and LE management    Balance Overall balance assessment: Needs assistance Sitting-balance support: Bilateral upper extremity supported;Feet supported Sitting balance-[name] Scale: Good     Standing balance support: Bilateral upper extremity supported Standing balance-[name] Scale: Fair                             ADL either performed or assessed with clinical judgement   ADL Overall ADL's : Needs assistance/impaired     Grooming: Set up           Upper Body Dressing : Set up;Sitting(for lines)   Lower Body Dressing: Maximal assistance;Sit to/from stand   Toilet Transfer: Moderate assistance;Ambulation   Toileting- Clothing Manipulation and Hygiene: Maximal assistance;Sit to/from stand               Vision  Patient Visual Report: No change from baseline            Cognition Arousal/Alertness: Awake/alert Behavior During Therapy: WFL for tasks assessed/performed Overall Cognitive Status: Within Functional Limits for tasks assessed                                                     Pertinent Vitals/ Pain       Pain Score: 4  Pain Location: bil knees Pain Descriptors / Indicators: Sore;Discomfort Pain Intervention(s): Limited activity within patient's tolerance;Monitored during session         Frequency  Min 2X/week        Progress Toward Goals  OT Goals(current goals can now be found in the care plan section)  Progress towards OT goals: Progressing toward goals     Plan Discharge plan remains appropriate       AM-PAC OT "6 Clicks" Daily Activity     Outcome Measure   Help from another person eating meals?: None Help from another person taking care of personal grooming?: A Little Help from another person toileting, which includes using toliet, bedpan, or urinal?: A Lot Help from another person bathing (including washing, rinsing, drying)?: A Lot Help from another person to put on and taking off regular upper body clothing?: A Little Help from another  person to put on and taking off regular lower body clothing?: A Lot 6 Click Score: 16    End of Session Equipment Utilized During Treatment: Gait belt;Rolling walker;Right knee immobilizer  OT Visit Diagnosis: Muscle weakness (generalized) (M62.81);Pain Pain - part of body: Knee(bil)   Activity Tolerance Patient tolerated treatment well   Patient Left in chair;with call bell/phone within reach;with chair alarm set   Nurse Communication          Time: 1100-1135 OT Time Calculation (min): 35 min  Charges: OT General Charges $OT Visit: 1 Visit OT Treatments $Self Care/Home Management : 23-37 mins  Kari Baars, [place] [phone] Office- [place], Edwena Felty D 12/05/2018, 1:49 PM

## 2018-12-06 NOTE — Progress Notes (Signed)
Physical Therapy Treatment Patient Details Name: Joanna Barber MRN: [id number] DOB: 11/19/1965 Today's Date: 12/06/2018    History of Present Illness Pt s/p Bil TKR    PT Comments    POD # 6 pm session Assisted OOB to attempt stairs as ins cont to deny Rehab. Pt has 7 steps to enter home.  Instructed on backward approach as best scenario.  General stair comments: attempted one step up backward using walker.  Unable to complete.  Partial left foot step up caused intense pain to R knee plus B knee buckled with near fall - Therapist recovered.  Pt does NOT have the knee stabilty and strength to perform.  Pt is NOT safe to D/C to home. HIGH FALL RISK.  High risk for re admit.  Pt need short term Rehab.  Follow Up Recommendations  CIR     Equipment Recommendations  None recommended by PT    Recommendations for Other Services       Precautions / Restrictions Precautions Precautions: Knee;Fall Precaution Comments: did not use KI's this session Required Braces or Orthoses: Knee Immobilizer - Right Restrictions Weight Bearing Restrictions: No Other Position/Activity Restrictions: WBAT    Mobility  Bed Mobility Overal bed mobility: Needs Assistance Bed Mobility: Supine to Sit     Supine to sit: Supervision;Min guard Sit to supine: Mod assist   General bed mobility comments: assist to manage LEs  Transfers Overall transfer level: Needs assistance Equipment used: Rolling walker (2 wheeled) Transfers: Sit to/from [employer]Stand;Stand Pivot Transfers Sit to Stand: Min assist Stand pivot transfers: Min assist       General transfer comment: increased time, limited by pain, swelling and limited ROM  Ambulation/Gait Ambulation/Gait assistance: Min guard;Min assist Gait Distance (Feet): 8 Feet Assistive device: Rolling walker (2 wheeled) Gait Pattern/deviations: Step-to pattern;Shuffle;Trunk flexed;Decreased step length - right;Decreased step length - left Gait velocity:  decreased   General Gait Details: decreased amb distance to attempt stairs   Stairs Stairs: Yes Stairs assistance: Max assist;Total assist Stair Management: No rails;Alternating pattern;Backwards;With walker Number of Stairs: 1 General stair comments: attempted one step up backward using walker.  Unable to complete.  Partial left foot step up caused intense pain to R plus B knee buckled with near fall - Therapist recovered.  Pt does NOT have the knee stabilty and strength to perform.   Wheelchair Mobility    Modified Rankin (Stroke Patients Only)       Balance Overall balance assessment: Needs assistance Sitting-balance support: Bilateral upper extremity supported;Feet supported Sitting balance-[name] Scale: Good     Standing balance support: Bilateral upper extremity supported Standing balance-[name] Scale: Fair                              Cognition Arousal/Alertness: Awake/alert Behavior During Therapy: WFL for tasks assessed/performed Overall Cognitive Status: Within Functional Limits for tasks assessed                                        Exercises  B LE knee flex/heel slides AAROM using belt  R knee 5 - 75 degrees with 8/10 pain  L knee 5 - 64 degrees with 6/10 pain    General Comments        Pertinent Vitals/Pain Pain Assessment: 0-10 Pain Score: 8  Pain Location: bil knees   R>L Pain Descriptors / Indicators:  Sore;Discomfort;Tightness;Tender Pain Intervention(s): Monitored during session;Patient requesting pain meds-RN notified;Ice applied    Home Living                      Prior Function            PT Goals (current goals can now be found in the care plan section) Progress towards PT goals: Progressing toward goals    Frequency    7X/week      PT Plan Current plan remains appropriate    Co-evaluation              AM-PAC PT "6 Clicks" Mobility   Outcome Measure  Help needed turning from  your back to your side while in a flat bed without using bedrails?: A Lot Help needed moving from lying on your back to sitting on the side of a flat bed without using bedrails?: A Lot Help needed moving to and from a bed to a chair (including a wheelchair)?: A Lot Help needed standing up from a chair using your arms (e.g., wheelchair or bedside chair)?: A Lot Help needed to walk in hospital room?: A Lot Help needed climbing 3-5 steps with a railing? : Total 6 Click Score: 11    End of Session Equipment Utilized During Treatment: Gait belt Activity Tolerance: Patient limited by pain Patient left: in bed;with call bell/phone within reach;with bed alarm set Nurse Communication: Mobility status;Patient requests pain meds PT Visit Diagnosis: Difficulty in walking, not elsewhere classified (R26.2)     Time: 4097-3532 PT Time Calculation (min) (ACUTE ONLY): 28 min  Charges:  $Gait Training: 8-22 mins $Therapeutic Exercise: 8-22 mins                     Rica Koyanagi  PTA Acute  Rehabilitation Services Pager      [phone] Office      [phone]

## 2018-12-06 NOTE — Progress Notes (Signed)
Physical Therapy Treatment Patient Details Name: Joanna Barber MRN: [id number] DOB: 02-16-1966 Today's Date: 12/06/2018    History of Present Illness Pt s/p Bil TKR    PT Comments    POD # 6 Pt progressing slowly due to pain, swelling, limited ROM and weakness.  Assisted OOB to amb 11 feet to bathroom was a struggle.  Very unsteady gait with increased assist esp with turns and backward gait in bathroom.  Assisted back to bed and requested pain meds.  Applied ICE. Pt is NOT safe to D/C to home.   Follow Up Recommendations  CIR     Equipment Recommendations  None recommended by PT    Recommendations for Other Services       Precautions / Restrictions Precautions Precautions: Knee;Fall Precaution Comments: did not use KI's this session Required Braces or Orthoses: Knee Immobilizer - Right Restrictions Weight Bearing Restrictions: No Other Position/Activity Restrictions: WBAT    Mobility  Bed Mobility Overal bed mobility: Needs Assistance Bed Mobility: Supine to Sit     Supine to sit: Supervision;Min guard Sit to supine: Mod assist   General bed mobility comments: assist to manage LEs  Transfers Overall transfer level: Needs assistance Equipment used: Rolling walker (2 wheeled) Transfers: Sit to/from [employer] Sit to Stand: Min assist Stand pivot transfers: Min assist       General transfer comment: increased time, limited by pain, swelling and limited ROM  Ambulation/Gait Ambulation/Gait assistance: Min guard;Min assist Gait Distance (Feet): 22 Feet(to and from bathroom only due to pain) Assistive device: Rolling walker (2 wheeled) Gait Pattern/deviations: Step-to pattern;Shuffle;Trunk flexed;Decreased step length - right;Decreased step length - left Gait velocity: decreased   General Gait Details: only amb to and from bathroom due to pain level   Stairs             Wheelchair Mobility    Modified Rankin (Stroke Patients  Only)       Balance Overall balance assessment: Needs assistance Sitting-balance support: Bilateral upper extremity supported;Feet supported Sitting balance-[name] Scale: Good     Standing balance support: Bilateral upper extremity supported Standing balance-[name] Scale: Fair                              Cognition Arousal/Alertness: Awake/alert Behavior During Therapy: WFL for tasks assessed/performed Overall Cognitive Status: Within Functional Limits for tasks assessed                                        Exercises      General Comments        Pertinent Vitals/Pain Pain Assessment: 0-10 Pain Score: 8  Pain Location: bil knees   R>L Pain Descriptors / Indicators: Sore;Discomfort;Tightness;Tender Pain Intervention(s): Monitored during session;Patient requesting pain meds-RN notified;Ice applied    Home Living                      Prior Function            PT Goals (current goals can now be found in the care plan section) Progress towards PT goals: Progressing toward goals    Frequency    7X/week      PT Plan Current plan remains appropriate    Co-evaluation              AM-PAC PT "6 Clicks" Mobility  Outcome Measure  Help needed turning from your back to your side while in a flat bed without using bedrails?: A Lot Help needed moving from lying on your back to sitting on the side of a flat bed without using bedrails?: A Lot Help needed moving to and from a bed to a chair (including a wheelchair)?: A Lot Help needed standing up from a chair using your arms (e.g., wheelchair or bedside chair)?: A Lot Help needed to walk in hospital room?: A Lot Help needed climbing 3-5 steps with a railing? : Total 6 Click Score: 11    End of Session Equipment Utilized During Treatment: Gait belt Activity Tolerance: Patient limited by pain Patient left: in bed;with call bell/phone within reach;with bed alarm set Nurse  Communication: Mobility status;Patient requests pain meds PT Visit Diagnosis: Difficulty in walking, not elsewhere classified (R26.2)     Time: [id number] PT Time Calculation (min) (ACUTE ONLY): 18 min  Charges:  $Gait Training: 8-22 mins                     Felecia ShellingLori Chiara Coltrin  PTA Acute  Rehabilitation Services Pager      [phone] Office      [phone]

## 2018-12-06 NOTE — Progress Notes (Signed)
Occupational Therapy Treatment Patient Details Name: Joanna Barber MRN: [id number] DOB: 1966-02-19 Today's Date: 12/06/2018    History of present illness Pt s/p Bil TKR   OT comments  Pts husband coming home from beach this day and will be there to A pt as needed.  Pt wanted to go to rehab but this has been denied.     Follow Up Recommendations  Home health OT;Supervision/Assistance - 24 hour    Equipment Recommendations  3 in 1 bedside commode    Recommendations for Other Services      Precautions / Restrictions Precautions Precautions: Knee;Fall Required Braces or Orthoses: Knee Immobilizer - Right Restrictions Weight Bearing Restrictions: No Other Position/Activity Restrictions: WBAT       Mobility Bed Mobility Overal bed mobility: Needs Assistance Bed Mobility: Supine to Sit     Supine to sit: Supervision        Transfers Overall transfer level: Needs assistance Equipment used: Rolling walker (2 wheeled) Transfers: Sit to/from [employer]Stand;Stand Pivot Transfers Sit to Stand: Min assist Stand pivot transfers: Min assist       General transfer comment: VC for encouragement.  Pt anxious about DC plan.    Balance Overall balance assessment: Needs assistance Sitting-balance support: Bilateral upper extremity supported;Feet supported Sitting balance-[name] Scale: Good     Standing balance support: Bilateral upper extremity supported Standing balance-[name] Scale: Fair                             ADL either performed or assessed with clinical judgement   ADL Overall ADL's : Needs assistance/impaired     Grooming: Standing;Supervision/safety   Upper Body Bathing: Set up;Sitting   Lower Body Bathing: Minimal assistance;Sit to/from stand;Cueing for safety;With adaptive equipment;Cueing for sequencing;Cueing for compensatory techniques   Upper Body Dressing : Set up;Sitting   Lower Body Dressing: Minimal assistance;Cueing for safety;With  adaptive equipment;Cueing for sequencing;Cueing for compensatory techniques;Sit to/from stand   Toilet Transfer: Minimal assistance;Cueing for safety;Cueing for sequencing;Stand-pivot;BSC;RW   Toileting- Clothing Manipulation and Hygiene: Minimal assistance;Sit to/from stand;Cueing for compensatory techniques;Cueing for safety;Cueing for sequencing       Functional mobility during ADLs: Minimal assistance;Rolling walker;Cueing for safety       Vision Patient Visual Report: No change from baseline            Cognition Arousal/Alertness: Awake/alert Behavior During Therapy: WFL for tasks assessed/performed Overall Cognitive Status: Within Functional Limits for tasks assessed                                                     Pertinent Vitals/ Pain       Pain Score: 3  Pain Location: bil knees   R>L Pain Descriptors / Indicators: Sore;Discomfort;Tightness Pain Intervention(s): Limited activity within patient's tolerance         Frequency  Min 2X/week        Progress Toward Goals  OT Goals(current goals can now be found in the care plan section)  Progress towards OT goals: Progressing toward goals     Plan Discharge plan needs to be updated       AM-PAC OT "6 Clicks" Daily Activity     Outcome Measure   Help from another person eating meals?: None Help from another person taking care of personal grooming?: A  Little Help from another person toileting, which includes using toliet, bedpan, or urinal?: A Little Help from another person bathing (including washing, rinsing, drying)?: A Little Help from another person to put on and taking off regular upper body clothing?: A Little Help from another person to put on and taking off regular lower body clothing?: A Little 6 Click Score: 19    End of Session Equipment Utilized During Treatment: Gait belt;Rolling walker;Right knee immobilizer  OT Visit Diagnosis: Muscle weakness (generalized)  (M62.81);Pain Pain - part of body: Knee(bil)   Activity Tolerance Patient tolerated treatment well   Patient Left in chair;with call bell/phone within reach;with chair alarm set   Nurse Communication          Time: 3009-2330 OT Time Calculation (min): 20 min  Charges: OT General Charges $OT Visit: 1 Visit OT Treatments $Self Care/Home Management : 8-22 mins  Kari Baars, [place] [phone] Office- [phone]      Shanelle Clontz, Edwena Felty D 12/06/2018, 1:06 PM

## 2018-12-06 NOTE — Progress Notes (Signed)
Inpatient Rehabilitation-Admissions Coordinator   [facility] received notice this AM that the pt's denial was upheld, despite peer to peer. Pt and physician were aware of denial. AC will sign off and alert CM need for new dispo plans.   Please call if questions.   Jhonnie Garner, OTR/L  Rehab Admissions Coordinator  [phone] 12/06/2018 9:35 AM

## 2018-12-06 NOTE — Progress Notes (Signed)
   Subjective: 6 Days Post-Op Procedure(s) (LRB): TOTAL KNEE BILATERAL (Bilateral) Patient reports pain as mild.   Patient seen in rounds by Dr. Wynelle Link. Patient is well, and has had no acute complaints or problems. Denies chest pain or SOB. No issues overnight. Admission to inpatient rehab was denied by insurance, new plan is for patient to remain in hospital until she is safe for discharge to home.  Plan is to go Home after hospital stay.  Objective: Vital signs in last 24 hours: Temp:  [97.9 F (36.6 C)-98.2 F (36.8 C)] 98 F (36.7 C) (07/21 0445) Pulse Rate:  [78-88] 87 (07/21 0445) Resp:  [16-18] 18 (07/21 0445) BP: (121-124)/(68-78) 121/72 (07/21 0445) SpO2:  [96 %-98 %] 98 % (07/21 0445)  Intake/Output from previous day:  Intake/Output Summary (Last 24 hours) at 12/06/2018 0748 Last data filed at 12/06/2018 0500 Gross per 24 hour  Intake 1080 ml  Output -  Net 1080 ml    Labs: Recent Labs    12/04/18 0341  HGB 8.1*   Recent Labs    12/04/18 0341  WBC 11.8*  RBC 2.54*  HCT 26.4*  PLT 264   Exam: General - Patient is Alert and Oriented Extremity - Neurologically intact Neurovascular intact Sensation intact distally Dorsiflexion/Plantar flexion intact Dressing/Incision - clean, dry, no drainage Motor Function - intact, moving foot and toes well on exam.   Past Medical History:  Diagnosis Date  . Arthritis    bi lat knees  . Hypertension     Assessment/Plan: 6 Days Post-Op Procedure(s) (LRB): TOTAL KNEE BILATERAL (Bilateral) Principal Problem:   OA (osteoarthritis) of knee  Estimated body mass index is 34.06 kg/m as calculated from the following:   Height as of this encounter: 5\' 2"  (1.575 m).   Weight as of this encounter: 84.5 kg. Up with therapy  DVT Prophylaxis - Xarelto Weight-bearing as tolerated  Continue working with physical therapy. Plan for discharge later this week.   Theresa Duty, PA-C Orthopedic Surgery 12/06/2018, 7:48  AM

## 2018-12-07 NOTE — Progress Notes (Signed)
   Subjective: 7 Days Post-Op Procedure(s) (LRB): TOTAL KNEE BILATERAL (Bilateral) Patient reports pain as moderate.   Patient seen in rounds with Dr. Wynelle Link. Patient is well, and has had no acute complaints or problems. Knee pain is continuing to improve, she is still progressing with physical therapy. Denies chest pain or SOB. Voiding without difficulty and positive flatus. No issues overnight.  Plan is to go Home after hospital stay.  Objective: Vital signs in last 24 hours: Temp:  [98 F (36.7 C)-98.4 F (36.9 C)] 98.4 F (36.9 C) (07/22 0549) Pulse Rate:  [85-88] 85 (07/22 0549) Resp:  [16] 16 (07/22 0549) BP: (121-148)/(66-80) 148/80 (07/22 0549) SpO2:  [97 %-99 %] 99 % (07/22 0549)  Intake/Output from previous day:  Intake/Output Summary (Last 24 hours) at 12/07/2018 0721 Last data filed at 12/07/2018 0600 Gross per 24 hour  Intake 1200 ml  Output 800 ml  Net 400 ml   Exam: General - Patient is Alert and Oriented Extremity - Neurologically intact Neurovascular intact Sensation intact distally Dorsiflexion/Plantar flexion intact Dressing/Incision - clean, dry, no drainage Motor Function - intact, moving foot and toes well on exam.   Past Medical History:  Diagnosis Date  . Arthritis    bi lat knees  . Hypertension     Assessment/Plan: 7 Days Post-Op Procedure(s) (LRB): TOTAL KNEE BILATERAL (Bilateral) Principal Problem:   OA (osteoarthritis) of knee  Estimated body mass index is 34.06 kg/m as calculated from the following:   Height as of this encounter: 5\' 2"  (1.575 m).   Weight as of this encounter: 84.5 kg. Up with therapy  DVT Prophylaxis - Xarelto Weight-bearing as tolerated  Plan is for discharge to home with HHPT once she is meeting her goals with therapy. She will not discharge to a SNF, the risk of contracting [id number] would complicate her rehabilitation course; she additionally has a  husband who is immunocompromised. Will remain here until  safe for discharge.  Theresa Duty, PA-C Orthopedic Surgery 12/07/2018, 7:21 AM

## 2018-12-07 NOTE — Progress Notes (Signed)
Occupational Therapy Treatment Patient Details Name: Joanna Barber MRN: [id number] DOB: 1966/05/11 Today's Date: 12/07/2018    History of present illness Pt s/p Bil TKR   OT comments  Pt overall S- min A with ADL activity at this time  Follow Up Recommendations  Supervision - Intermittent    Equipment Recommendations  3 in 1 bedside commode    Recommendations for Other Services      Precautions / Restrictions Precautions Precautions: Knee;Fall Precaution Comments: did not use KI's this session Restrictions Weight Bearing Restrictions: No Other Position/Activity Restrictions: WBAT       Mobility Bed Mobility Overal bed mobility: Needs Assistance       Supine to sit: Modified independent (Device/Increase time)        Transfers Overall transfer level: Needs assistance Equipment used: Rolling walker (2 wheeled) Transfers: Sit to/from [facility] Sit to Stand: Min guard Stand pivot transfers: Supervision            Balance Overall balance assessment: Needs assistance Sitting-balance support: Bilateral upper extremity supported;Feet supported Sitting balance-[name] Scale: Good     Standing balance support: Bilateral upper extremity supported Standing balance-[name] Scale: Fair                             ADL either performed or assessed with clinical judgement   ADL Overall ADL's : Needs assistance/impaired             Lower Body Bathing: Minimal assistance;Cueing for safety;Cueing for compensatory techniques;Cueing for sequencing;Sit to/from stand   Upper Body Dressing : Set up;Sitting       Toilet Transfer: Supervision/safety;RW;Comfort height toilet   Toileting- Clothing Manipulation and Hygiene: Supervision/safety;Sit to/from stand;Cueing for safety   Tub/ Shower Transfer: Walk-in shower;Cueing for sequencing;Cueing for safety;Minimal assistance;Rolling walker   Functional mobility during ADLs:  Supervision/safety;Min guard;Rolling walker;Cueing for sequencing General ADL Comments: Pt performed shower transfer this day!   Pt making progress daily     Vision Patient Visual Report: No change from baseline            Cognition Arousal/Alertness: Awake/alert Behavior During Therapy: WFL for tasks assessed/performed Overall Cognitive Status: Within Functional Limits for tasks assessed                                                     Pertinent Vitals/ Pain       Pain Score: 5  Pain Location: bil knees   R>L Pain Descriptors / Indicators: Sore;Discomfort;Tightness;Tender Pain Intervention(s): Limited activity within patient's tolerance;Monitored during session            Progress Toward Goals  OT Goals(current goals can now be found in the care plan section)  Progress towards OT goals: Progressing toward goals     Plan Discharge plan remains appropriate       AM-PAC OT "6 Clicks" Daily Activity     Outcome Measure   Help from another person eating meals?: None Help from another person taking care of personal grooming?: None Help from another person toileting, which includes using toliet, bedpan, or urinal?: A Little Help from another person bathing (including washing, rinsing, drying)?: A Little Help from another person to put on and taking off regular upper body clothing?: None Help from another person to put on and taking  off regular lower body clothing?: A Little 6 Click Score: 21    End of Session Equipment Utilized During Treatment: Gait belt;Rolling walker  OT Visit Diagnosis: Unsteadiness on feet (R26.81)   Activity Tolerance Patient tolerated treatment well   Patient Left in chair;with call bell/phone within reach   Nurse Communication Mobility status        Time: 1007-1030 OT Time Calculation (min): 23 min  Charges: OT General Charges $OT Visit: 1 Visit OT Treatments $Self Care/Home Management : 23-37 mins  Lise AuerLori  Carrine Kroboth, OT Acute Rehabilitation Services [phone]- [phone] Office- [phone]      Chaz Mcglasson, Karin GoldenLorraine D 12/07/2018, 1:08 PM

## 2018-12-07 NOTE — Plan of Care (Signed)
  Problem: Education: Goal: Knowledge of the prescribed therapeutic regimen will improve Outcome: Progressing   Problem: Activity: Goal: Ability to avoid complications of mobility impairment will improve Outcome: Progressing   Problem: Activity: Goal: Range of joint motion will improve Outcome: Progressing   Problem: Clinical Measurements: Goal: Postoperative complications will be avoided or minimized Outcome: Progressing   Problem: Pain Management: Goal: Pain level will decrease with appropriate interventions Outcome: Progressing   Problem: Skin Integrity: Goal: Will show signs of wound healing Outcome: Progressing   

## 2018-12-07 NOTE — Progress Notes (Signed)
Physical Therapy Treatment Patient Details Name: Joanna Barber MRN: [id number] DOB: 23-Aug-1965 Today's Date: 12/07/2018    History of Present Illness Pt s/p Bil TKR    PT Comments    POD # 7 am session Pt progressing slowly due to pain, swelling and B TKR.  R knee pain 8/10 with TE's and L knee 4/10.  Pt is more able to self rise OOB.  Assisted with amb to bathroom then in hallway with increased time.  Returned to bed to perform some TKR TE's esp knee flex.   Will see again this afternoon to attempts stair training.     Follow Up Recommendations  Follow surgeon's recommendation for DC plan and follow-up therapies(Insurance has denied CIR now plan is for home)     Equipment Recommendations  None recommended by PT;3in1 (PT)    Recommendations for Other Services       Precautions / Restrictions Precautions Precautions: Knee;Fall Precaution Comments: did not use KI's this session Restrictions Weight Bearing Restrictions: No Other Position/Activity Restrictions: WBAT    Mobility  Bed Mobility Overal bed mobility: Modified Independent       Supine to sit: Modified independent (Device/Increase time)     General bed mobility comments: increased time pt is self able using momentum and core strength  Transfers Overall transfer level: Needs assistance Equipment used: Rolling walker (2 wheeled) Transfers: Sit to/from [employer] CorporationStand;Stand Pivot Transfers Sit to Stand: Supervision;Min guard Stand pivot transfers: Supervision;Min guard       General transfer comment: increased time, limited by pain, swelling and limited ROM  Ambulation/Gait Ambulation/Gait assistance: Supervision;Min guard Gait Distance (Feet): 32 Feet Assistive device: Rolling walker (2 wheeled) Gait Pattern/deviations: Step-to pattern;Shuffle;Trunk flexed;Decreased step length - right;Decreased step length - left Gait velocity: decreased   General Gait Details: distance limited by R knee pain   Stairs             Wheelchair Mobility    Modified Rankin (Stroke Patients Only)       Balance Overall balance assessment: Needs assistance Sitting-balance support: Bilateral upper extremity supported;Feet supported Sitting balance-[name] Scale: Good     Standing balance support: Bilateral upper extremity supported Standing balance-[name] Scale: Fair                              Cognition Arousal/Alertness: Awake/alert Behavior During Therapy: WFL for tasks assessed/performed Overall Cognitive Status: Within Functional Limits for tasks assessed                                        Exercises  10 reps B LE HS with a 5 sec hold and 1- reps LAQ's while EOB    General Comments        Pertinent Vitals/Pain Pain Assessment: 0-10 Pain Score: 7  Pain Location: bil knees   R>L Pain Descriptors / Indicators: Sore;Discomfort;Tightness;Tender Pain Intervention(s): Monitored during session;Premedicated before session;Repositioned;Ice applied    Home Living                      Prior Function            PT Goals (current goals can now be found in the care plan section) Progress towards PT goals: Progressing toward goals    Frequency    7X/week      PT Plan Current plan remains appropriate  Co-evaluation              AM-PAC PT "6 Clicks" Mobility   Outcome Measure  Help needed turning from your back to your side while in a flat bed without using bedrails?: A Little Help needed moving from lying on your back to sitting on the side of a flat bed without using bedrails?: A Little Help needed moving to and from a bed to a chair (including a wheelchair)?: A Little Help needed standing up from a chair using your arms (e.g., wheelchair or bedside chair)?: A Little Help needed to walk in hospital room?: A Little Help needed climbing 3-5 steps with a railing? : Total 6 Click Score: 16    End of Session Equipment Utilized During  Treatment: Gait belt Activity Tolerance: Patient limited by pain Patient left: in bed;with call bell/phone within reach;with bed alarm set;Other (comment)(ICE applied) Nurse Communication: Mobility status;Patient requests pain meds PT Visit Diagnosis: Difficulty in walking, not elsewhere classified (R26.2)     Time: 5374-8270 PT Time Calculation (min) (ACUTE ONLY): 26 min  Charges:  $Gait Training: 8-22 mins $Therapeutic Exercise: 8-22 mins                     Rica Koyanagi  PTA Acute  Rehabilitation Services Pager      [phone] Office      [phone]

## 2018-12-07 NOTE — Progress Notes (Signed)
Physical Therapy Treatment Patient Details Name: Joanna Barber MRN: [id number] DOB: 12-04-1965 Today's Date: 12/07/2018    History of Present Illness Pt s/p Bil TKR    PT Comments    POD # 7 pm session Assisted OOB to amb to bathroom then back to EOB for a brief break before attempting stairs.  General stair comments: pt was able to complete 2 steps up backward with much assist and 9/10 R knee pain.  Very difficult to perform due to limited ROM and impaired Quad strength from surgery.  Will continue to practice daily.   Follow Up Recommendations  Follow surgeon's recommendation for DC plan and follow-up therapies (Insurance has denied CIR now plan is for home when ready     Equipment Recommendations  RW 3:1    Recommendations for Other Services       Precautions / Restrictions Precautions Precautions: Knee;Fall Precaution Comments: did not use KI's this session Restrictions Weight Bearing Restrictions: No Other Position/Activity Restrictions: WBAT    Mobility  Bed Mobility Overal bed mobility: Modified Independent       Supine to sit: Modified independent (Device/Increase time)     General bed mobility comments: increased time pt is self able using momentum and core strength  Transfers Overall transfer level: Needs assistance Equipment used: Rolling walker (2 wheeled) Transfers: Sit to/from [employer]Stand;Stand Pivot Transfers Sit to Stand: Supervision;Min guard Stand pivot transfers: Supervision;Min guard       General transfer comment: increased time, limited by pain, swelling and limited ROM  Ambulation/Gait Ambulation/Gait assistance: Supervision;Min guard Gait Distance (Feet): 8 Feet Assistive device: Rolling walker (2 wheeled) Gait Pattern/deviations: Step-to pattern;Shuffle;Trunk flexed;Decreased step length - right;Decreased step length - left Gait velocity: decreased   General Gait Details: decreased amb distance as session focus was  stairs   Stairs Stairs: Yes Stairs assistance: Mod assist;Max assist Stair Management: No rails;Step to pattern;Backwards;With walker Number of Stairs: 2 General stair comments: pt was able to complete 2 steps up backward with much assist and 9/10 R knee pain.  Very difficult to perform due to limited ROM and impaired Quad strength from surgery.  Will continue to practice daily.   Wheelchair Mobility    Modified Rankin (Stroke Patients Only)       Balance Overall balance assessment: Needs assistance Sitting-balance support: Bilateral upper extremity supported;Feet supported Sitting balance-[name] Scale: Good     Standing balance support: Bilateral upper extremity supported Standing balance-[name] Scale: Fair                              Cognition Arousal/Alertness: Awake/alert Behavior During Therapy: WFL for tasks assessed/performed Overall Cognitive Status: Within Functional Limits for tasks assessed                                        Exercises      General Comments        Pertinent Vitals/Pain Pain Assessment: 0-10 Pain Score: 7  Pain Location: bil knees   R>L Pain Descriptors / Indicators: Sore;Discomfort;Tightness;Tender Pain Intervention(s): Monitored during session;Premedicated before session;Repositioned;Ice applied    Home Living                      Prior Function            PT Goals (current goals can now be found in the  care plan section) Progress towards PT goals: Progressing toward goals    Frequency    7X/week      PT Plan Current plan remains appropriate    Co-evaluation              AM-PAC PT "6 Clicks" Mobility   Outcome Measure  Help needed turning from your back to your side while in a flat bed without using bedrails?: A Little Help needed moving from lying on your back to sitting on the side of a flat bed without using bedrails?: A Little Help needed moving to and from a bed to  a chair (including a wheelchair)?: A Little Help needed standing up from a chair using your arms (e.g., wheelchair or bedside chair)?: A Little Help needed to walk in hospital room?: A Little Help needed climbing 3-5 steps with a railing? : Total 6 Click Score: 16    End of Session Equipment Utilized During Treatment: Gait belt Activity Tolerance: Patient limited by pain Patient left: in bed;with call bell/phone within reach;with bed alarm set;Other (comment)(ICE applied) Nurse Communication: Mobility status;Patient requests pain meds PT Visit Diagnosis: Difficulty in walking, not elsewhere classified (R26.2)     Time: 1335-1400 PT Time Calculation (min) (ACUTE ONLY): 25 min  Charges:  $Gait Training: 8-22 mins $Therapeutic Activity: 8-22 mins                     Rica Koyanagi  PTA Acute  Rehabilitation Services Pager      [phone] Office      [phone]

## 2018-12-08 NOTE — Progress Notes (Signed)
Occupational Therapy Treatment Patient Details Name: Tayia Stonesifer MRN: [id number] DOB: Jul 18, 1965 Today's Date: 12/08/2018    History of present illness Pt s/p Bil TKR   OT comments  Education completed today. Pt is at a supervision level for adls/toilet transfers.  Will sign off.  Follow Up Recommendations  Supervision - Intermittent    Equipment Recommendations  3 in 1 bedside commode    Recommendations for Other Services      Precautions / Restrictions Precautions Precautions: Knee;Fall Restrictions Other Position/Activity Restrictions: WBAT       Mobility Bed Mobility Overal bed mobility: Modified Independent             General bed mobility comments: flat bed; use of gait belt on RLE for back to bed  Transfers   Equipment used: Rolling walker (2 wheeled)   Sit to Stand: Supervision         General transfer comment: cues for UE placement    Balance                                           ADL either performed or assessed with clinical judgement   ADL                                         General ADL Comments: pt able to complete ADL from supervision level with AE.  Re-educated on sock aide. She feels she won't use this; husband will assist.  Pt verbalizes comfort with shower transfer. Talked about 3:1 placement.  She feels she will have husband buy her a shower seat so legs don't have to be wiped off     Vision       Perception     Praxis      Cognition Arousal/Alertness: Awake/alert Behavior During Therapy: WFL for tasks assessed/performed Overall Cognitive Status: Within Functional Limits for tasks assessed                                          Exercises     Shoulder Instructions       General Comments      Pertinent Vitals/ Pain       Pain Score: 6  Pain Location: R > L knee Pain Descriptors / Indicators: Sore Pain Intervention(s): Limited activity within  patient's tolerance;Monitored during session;Premedicated before session;Repositioned  Home Living                                          Prior Functioning/Environment              Frequency           Progress Toward Goals  OT Goals(current goals can now be found in the care plan section)  Progress towards OT goals: Goals met/education completed, patient discharged from [facility] OT "6 Clicks" Daily Activity     Outcome Measure   Help from another person eating  meals?: None Help from another person taking care of personal grooming?: A Little Help from another person toileting, which includes using toliet, bedpan, or urinal?: A Little Help from another person bathing (including washing, rinsing, drying)?: A Little Help from another person to put on and taking off regular upper body clothing?: A Little Help from another person to put on and taking off regular lower body clothing?: A Little 6 Click Score: 19    End of Session        Activity Tolerance Patient tolerated treatment well   Patient Left in bed;with call bell/phone within reach   Nurse Communication          Time: 5750-5183 OT Time Calculation (min): 22 min  Charges: OT General Charges $OT Visit: 1 Visit OT Treatments $Self Care/Home Management : 8-22 mins  Lesle Chris, OTR/L Acute Rehabilitation Services [phone] [facility] pager [phone] office 12/08/2018   [place] 12/08/2018, 9:12 AM

## 2018-12-08 NOTE — Progress Notes (Signed)
Physical Therapy Treatment Patient Details Name: Joanna Barber MRN: [id number] DOB: Oct 21, 1965 Today's Date: 12/08/2018    History of Present Illness Pt s/p Bil TKR    PT Comments    POD # 8 am session Assisted OOB to amb to bathroom all at Supervision level.  Tolerated an increased distance.  Tightness/pain/swelling are her biggest c/o.  R > L pain.  Then returned to room to perform some TE's following HEP handout.  Instructed on proper tech, freq as well as use of ICE.     Follow Up Recommendations  Follow surgeon's recommendation for DC plan and follow-up therapies     Equipment Recommendations  Rolling walker with 5" wheels;3in1 (PT)    Recommendations for Other Services       Precautions / Restrictions Precautions Precautions: Knee;Fall Precaution Comments: did not use KI's this session Restrictions Weight Bearing Restrictions: No Other Position/Activity Restrictions: WBAT    Mobility  Bed Mobility Overal bed mobility: Modified Independent             General bed mobility comments: uses belt to self assist R LE out and onto bed  Transfers Overall transfer level: Needs assistance Equipment used: Rolling walker (2 wheeled) Transfers: Sit to/from [facility] Sit to Stand: Supervision Stand pivot transfers: Supervision       General transfer comment: increased time  Ambulation/Gait Ambulation/Gait assistance: Supervision;Min guard Gait Distance (Feet): 85 Feet Assistive device: Rolling walker (2 wheeled) Gait Pattern/deviations: Step-to pattern;Shuffle;Trunk flexed;Decreased step length - right;Decreased step length - left Gait velocity: decreased   General Gait Details: tolerated an increased distance   Chief Strategy Officer    Modified Rankin (Stroke Patients Only)       Balance                                            Cognition Arousal/Alertness: Awake/alert Behavior  During Therapy: WFL for tasks assessed/performed Overall Cognitive Status: Within Functional Limits for tasks assessed                                        Exercises  14 reps B LE HS using belt  R knee ROM flex 70 degrees L knee ROM flex 85 degrees    General Comments        Pertinent Vitals/Pain Pain Assessment: 0-10 Pain Score: 6  Pain Location: R > L knee Pain Descriptors / Indicators: Sore;Tightness Pain Intervention(s): Monitored during session;Repositioned;Ice applied    Home Living                      Prior Function            PT Goals (current goals can now be found in the care plan section) Progress towards PT goals: Progressing toward goals    Frequency    7X/week      PT Plan Current plan remains appropriate    Co-evaluation              AM-PAC PT "6 Clicks" Mobility   Outcome Measure  Help needed turning from your back to your side while in a flat bed without using bedrails?: A Little Help needed moving from lying on your  back to sitting on the side of a flat bed without using bedrails?: A Little Help needed moving to and from a bed to a chair (including a wheelchair)?: A Little   Help needed to walk in hospital room?: A Little Help needed climbing 3-5 steps with a railing? : A Little 6 Click Score: 15    End of Session Equipment Utilized During Treatment: Gait belt Activity Tolerance: Patient tolerated treatment well Patient left: in bed;with call bell/phone within reach;with bed alarm set;Other (comment)   PT Visit Diagnosis: Difficulty in walking, not elsewhere classified (R26.2)     Time: [id number] PT Time Calculation (min) (ACUTE ONLY): 33 min  Charges:  $Gait Training: 8-22 mins $Therapeutic Exercise: 8-22 mins                     Felecia ShellingLori Clair Bardwell  PTA Acute  Rehabilitation Services Pager      [phone] Office      [phone]

## 2018-12-08 NOTE — Progress Notes (Signed)
Physical Therapy Treatment Patient Details Name: Joanna Barber MRN: [id number] DOB: May 27, 1965 Today's Date: 12/08/2018    History of Present Illness Pt s/p Bil TKR    PT Comments    POD # 8 pm session Assisted OOB to bathroom then amb in hallway to gym, practiced stairs then returned to room in a wheelchair.  Assisted back to bed and placed in CPM R LE 70 degrees.  Applied ICE. Pt plans to D/C to home tomorrow.    Follow Up Recommendations  Follow surgeon's recommendation for DC plan and follow-up therapies     Equipment Recommendations  Rolling walker with 5" wheels;3in1 (PT)    Recommendations for Other Services       Precautions / Restrictions Precautions Precautions: Knee;Fall Precaution Comments: did not use KI's this session Restrictions Weight Bearing Restrictions: No Other Position/Activity Restrictions: WBAT    Mobility  Bed Mobility Overal bed mobility: Modified Independent             General bed mobility comments: uses belt to self assist R LE out and onto bed  Transfers Overall transfer level: Needs assistance Equipment used: Rolling walker (2 wheeled) Transfers: Sit to/from [facility] Sit to Stand: Supervision Stand pivot transfers: Supervision       General transfer comment: increased time  Ambulation/Gait Ambulation/Gait assistance: Supervision;Min guard Gait Distance (Feet): 110 Feet Assistive device: Rolling walker (2 wheeled) Gait Pattern/deviations: Step-to pattern;Shuffle;Trunk flexed;Decreased step length - right;Decreased step length - left Gait velocity: decreased   General Gait Details: tolerated an increased distance (from pt room to gym)   Stairs   Stairs assistance: Min guard;Min assist Stair Management: One rail Right;Step to pattern;Forwards;With crutches Number of Stairs: 6 General stair comments: pt was able to perform stair training forward with one crutch and one rail.  Up with left and down  with right   Wheelchair Mobility    Modified Rankin (Stroke Patients Only)       Balance                                            Cognition Arousal/Alertness: Awake/alert Behavior During Therapy: WFL for tasks assessed/performed Overall Cognitive Status: Within Functional Limits for tasks assessed                                        Exercises      General Comments        Pertinent Vitals/Pain Pain Assessment: 0-10 Pain Score: 6  Pain Location: R > L knee Pain Descriptors / Indicators: Sore;Tightness Pain Intervention(s): Monitored during session;Repositioned;Ice applied    Home Living                      Prior Function            PT Goals (current goals can now be found in the care plan section) Progress towards PT goals: Progressing toward goals    Frequency    7X/week      PT Plan Current plan remains appropriate    Co-evaluation              AM-PAC PT "6 Clicks" Mobility   Outcome Measure  Help needed turning from your back to your side while in a flat bed without  using bedrails?: A Little Help needed moving from lying on your back to sitting on the side of a flat bed without using bedrails?: A Little Help needed moving to and from a bed to a chair (including a wheelchair)?: A Little   Help needed to walk in hospital room?: A Little Help needed climbing 3-5 steps with a railing? : A Little 6 Click Score: 15    End of Session Equipment Utilized During Treatment: Gait belt Activity Tolerance: Patient tolerated treatment well Patient left: in bed;with call bell/phone within reach;with bed alarm set;Other (comment)   PT Visit Diagnosis: Difficulty in walking, not elsewhere classified (R26.2)     Time: 1410-1455 PT Time Calculation (min) (ACUTE ONLY): 45 min  Charges:  $Gait Training: 23-37 mins $Therapeutic Activity: 8-22 mins                     Felecia ShellingLori Britne Borelli  PTA Acute   Rehabilitation Services Pager      [phone] Office      [phone]

## 2018-12-08 NOTE — Progress Notes (Signed)
   Subjective: 8 Days Post-Op Procedure(s) (LRB): TOTAL KNEE BILATERAL (Bilateral) Patient reports pain as mild.   Patient seen in rounds with Dr. Wynelle Link. Patient is well, and has had no acute complaints or problems. She reports that she is feeling good. Feels a little uneasy on the stairs. Denies SOB and chest pain. Voiding well. Positive BM.  Plan is to go Home after hospital stay.  Objective: Vital signs in last 24 hours: Temp:  [97.9 F (36.6 C)-98.6 F (37 C)] 98.6 F (37 C) (07/23 0530) Pulse Rate:  [86-88] 88 (07/23 0530) Resp:  [18-19] 18 (07/23 0530) BP: (122-134)/(71-98) 122/71 (07/23 0530) SpO2:  [97 %-100 %] 97 % (07/23 0530)  Intake/Output from previous day:  Intake/Output Summary (Last 24 hours) at 12/08/2018 0734 Last data filed at 12/08/2018 0200 Gross per 24 hour  Intake 700 ml  Output -  Net 700 ml     EXAM General - Patient is Alert and Oriented Extremity - Neurologically intact Intact pulses distally Dorsiflexion/Plantar flexion intact No cellulitis present Compartment soft Dressing/Incision - clean, dry, no drainage Motor Function - intact, moving foot and toes well on exam.   Past Medical History:  Diagnosis Date  . Arthritis    bi lat knees  . Hypertension     Assessment/Plan: 8 Days Post-Op Procedure(s) (LRB): TOTAL KNEE BILATERAL (Bilateral) Principal Problem:   OA (osteoarthritis) of knee  Estimated body mass index is 34.06 kg/m as calculated from the following:   Height as of this encounter: 5\' 2"  (1.575 m).   Weight as of this encounter: 84.5 kg. Advance diet Up with therapy Discharge home with home health tomorrow  DVT Prophylaxis - Xarelto Weight-Bearing as tolerated  Continue with therapy today, focusing on stairs. Plan to DC home tomorrow  With HHPT.   Ardeen Jourdain, PA-C Orthopaedic Surgery 12/08/2018, 7:34 AM

## 2018-12-08 NOTE — Plan of Care (Signed)
progressing 

## 2018-12-09 MED ORDER — HYDROMORPHONE HCL 2 MG PO TABS: 2.0000 mg | ORAL_TABLET | Freq: Four times a day (QID) | ORAL | 0 refills | Status: DC | PRN

## 2018-12-09 NOTE — TOC Transition Note (Signed)
Transition of Care [facility]) - CM/SW Discharge Note   Patient Details  Name: Alexis Reber MRN: [id number] Date of Birth: 10/05/1965  Transition of Care [facility]) CM/SW Contact:  Lia Hopping, [place] Phone Number: 12/09/2018, 9:33 AM   Clinical Narrative:    Home Health arranged through [facility] DME (RW and 3 in 1) delivered through [place]   Final next level of care: [place] Barriers to Discharge: No Barriers Identified   Patient Goals and CMS Choice Patient states their goals for this hospitalization and ongoing recovery are:: Recover at Home CMS Medicare.gov Compare Post Acute Care list provided to:: Patient    Discharge Placement  Home                     Discharge Plan and Services                DME Arranged: 3-N-1, [place] DME Agency: [employer] Date DME Agency Contacted: 12/08/18   Representative spoke with at DME Agency: Ovid Curd HH Arranged: PT [place] Level: [facility] [employer]) Date [place]: 12/09/18 Time [place]: [phone] Representative spoke with at [place]: [place] ([place]) Interventions     Readmission Risk Interventions No flowsheet data found.

## 2018-12-09 NOTE — Progress Notes (Signed)
   Subjective: 9 Days Post-Op Procedure(s) (LRB): TOTAL KNEE BILATERAL (Bilateral) Patient reports pain as mild.   Patient seen in rounds by Dr. Wynelle Link. Patient is well, and has had no acute complaints or problems. States she is feeling well and is ready to go home. Denies chest pain or SOB. No issues overnight. Voiding without difficulty.  Plan is to go Home after hospital stay.  Objective: Vital signs in last 24 hours: Temp:  [98.2 F (36.8 C)-98.8 F (37.1 C)] 98.8 F (37.1 C) (07/24 0528) Pulse Rate:  [78-86] 78 (07/24 0528) Resp:  [16-19] 18 (07/24 0528) BP: (119-151)/(73-84) 119/73 (07/24 0528) SpO2:  [92 %-100 %] 92 % (07/24 0528)  Intake/Output from previous day:  Intake/Output Summary (Last 24 hours) at 12/09/2018 0731 Last data filed at 12/09/2018 0600 Gross per 24 hour  Intake 1300 ml  Output 0 ml  Net 1300 ml    Exam: General - Patient is Alert and Oriented Extremity - Neurologically intact Neurovascular intact Sensation intact distally Dorsiflexion/Plantar flexion intact Dressing/Incision - clean, dry, no drainage Motor Function - intact, moving foot and toes well on exam.   Past Medical History:  Diagnosis Date  . Arthritis    bi lat knees  . Hypertension     Assessment/Plan: 9 Days Post-Op Procedure(s) (LRB): TOTAL KNEE BILATERAL (Bilateral) Principal Problem:   OA (osteoarthritis) of knee  Estimated body mass index is 34.06 kg/m as calculated from the following:   Height as of this encounter: 5\' 2"  (1.575 m).   Weight as of this encounter: 84.5 kg. Up with therapy  DVT Prophylaxis - Xarelto Weight-bearing as tolerated  Plan for discharge to home later today. Order placed for HHPT arrangements. Follow-up in the office in 1 week.  Theresa Duty, PA-C Orthopedic Surgery 12/09/2018, 7:31 AM

## 2018-12-09 NOTE — Progress Notes (Signed)
Physical Therapy Treatment Patient Details Name: Joanna Barber MRN: [id number] DOB: January 16, 1966 Today's Date: 12/09/2018    History of Present Illness Pt s/p Bil TKR    PT Comments    Pt continues to progress well with mobility and eager for dc home.  Pt reviewed home therex and stairs with written instruction provided.   Follow Up Recommendations  Follow surgeon's recommendation for DC plan and follow-up therapies     Equipment Recommendations  Rolling walker with 5" wheels;3in1 (PT)    Recommendations for Other Services       Precautions / Restrictions Precautions Precautions: Knee;Fall Knee Immobilizer - Right: Discontinue once straight leg raise with < 10 degree lag Knee Immobilizer - Left: Discontinue once straight leg raise with < 10 degree lag Restrictions Weight Bearing Restrictions: No Other Position/Activity Restrictions: WBAT    Mobility  Bed Mobility Overal bed mobility: Modified Independent Bed Mobility: Supine to Sit     Supine to sit: Modified independent (Device/Increase time)        Transfers Overall transfer level: Needs assistance Equipment used: Rolling walker (2 wheeled) Transfers: Sit to/from Stand Sit to Stand: Supervision         General transfer comment: increased time  Ambulation/Gait Ambulation/Gait assistance: Supervision;Min guard Gait Distance (Feet): 150 Feet Assistive device: Rolling walker (2 wheeled) Gait Pattern/deviations: Step-to pattern;Shuffle;Trunk flexed;Decreased step length - right;Decreased step length - left Gait velocity: decreased   General Gait Details: tolerated an increased distance (from pt room to gym)   Stairs Stairs: Yes Stairs assistance: Min guard;Min assist Stair Management: One rail Right;Step to pattern;Forwards;With crutches Number of Stairs: 4 General stair comments: pt was able to perform stair training forward with one crutch and one rail.  Up with left and down with  right   Wheelchair Mobility    Modified Rankin (Stroke Patients Only)       Balance Overall balance assessment: Needs assistance Sitting-balance support: Bilateral upper extremity supported;Feet supported Sitting balance-[name] Scale: Good     Standing balance support: Bilateral upper extremity supported Standing balance-[name] Scale: Fair                              Cognition Arousal/Alertness: Awake/alert Behavior During Therapy: WFL for tasks assessed/performed Overall Cognitive Status: Within Functional Limits for tasks assessed                                        Exercises Total Joint Exercises Ankle Circles/Pumps: AROM;Both;15 reps;Supine Quad Sets: AROM;Both;Supine;10 reps Heel Slides: AAROM;Both;Supine;15 reps Straight Leg Raises: Both;Supine;AAROM;AROM;20 reps Long Arc Quad: AAROM;Both;10 reps;Seated    General Comments        Pertinent Vitals/Pain Pain Assessment: 0-10 Pain Score: 6  Pain Location: R > L knee Pain Descriptors / Indicators: Sore;Tightness Pain Intervention(s): Limited activity within patient's tolerance;Monitored during session;Premedicated before session;Ice applied    Home Living                      Prior Function            PT Goals (current goals can now be found in the care plan section) Acute Rehab PT Goals Patient Stated Goal: Regain IND PT Goal Formulation: With patient Time For Goal Achievement: 12/15/18 Potential to Achieve Goals: Good Progress towards PT goals: Progressing toward goals    Frequency  7X/week      PT Plan Current plan remains appropriate    Co-evaluation              AM-PAC PT "6 Clicks" Mobility   Outcome Measure  Help needed turning from your back to your side while in a flat bed without using bedrails?: None Help needed moving from lying on your back to sitting on the side of a flat bed without using bedrails?: None Help needed moving to  and from a bed to a chair (including a wheelchair)?: A Little Help needed standing up from a chair using your arms (e.g., wheelchair or bedside chair)?: A Little Help needed to walk in hospital room?: A Little Help needed climbing 3-5 steps with a railing? : A Little 6 Click Score: 20    End of Session Equipment Utilized During Treatment: Gait belt Activity Tolerance: Patient tolerated treatment well Patient left: in chair;with call bell/phone within reach Nurse Communication: Mobility status;Patient requests pain meds PT Visit Diagnosis: Difficulty in walking, not elsewhere classified (R26.2)     Time: [id number] PT Time Calculation (min) (ACUTE ONLY): 44 min  Charges:  $Gait Training: 8-22 mins $Therapeutic Exercise: 8-22 mins $Therapeutic Activity: 8-22 mins                     Mauro KaufmannHunter Dick Hark PT Acute Rehabilitation Services Pager [phone] Office [phone]     Nafeesa Dils 12/09/2018, 12:51 PM

## 2018-12-12 NOTE — Discharge Summary (Signed)
Physician Discharge Summary   Patient ID: Joanna Barber MRN: [id number] DOB/AGE: August 18, 1965 53 y.o.  Admit date: 11/30/2018 Discharge date: 12/09/2018  Primary Diagnosis: Osteoarthritis, bilateral knees   Admission Diagnoses:  Past Medical History:  Diagnosis Date   Arthritis    bi lat knees   Hypertension    Discharge Diagnoses:   Principal Problem:   OA (osteoarthritis) of knee  Estimated body mass index is 34.06 kg/m as calculated from the following:   Height as of this encounter: 5\' 2"  (1.575 m).   Weight as of this encounter: 84.5 kg.  Procedure:  Procedure(s) (LRB): TOTAL KNEE BILATERAL (Bilateral)   Consults: None  HPI: Joanna Barber is a 54 y.o. year old female with end stage OA of both knees with progressively worsening pain and dysfunction. The patient has constant pain, with activity and at rest and significant functional deficits with difficulties even with ADLs. The patient has had extensive non-op management including analgesics, injections of cortisone and viscosupplements, and home exercise program, but remains in significant pain with significant dysfunction. We discussed replacing both knees in the same setting versus one at a time including procedure, risks, potential complications, rehab course, and pros and cons associated with each and the patient elects to do both knees at the same time. The patient presents now for bilateral Total Knee Arthroplasty.   Laboratory Data: Admission on 11/30/2018, Discharged on 12/09/2018  Component Date Value Ref Range Status   WBC 12/01/2018 13.3* 4.0 - 10.5 K/uL Final   RBC 12/01/2018 3.01* 3.87 - 5.11 MIL/uL Final   Hemoglobin 12/01/2018 9.9* 12.0 - 15.0 g/dL Final   HCT 46/96/2952 30.5* 36.0 - 46.0 % Final   MCV 12/01/2018 101.3* 80.0 - 100.0 fL Final   MCH 12/01/2018 32.9  26.0 - 34.0 pg Final   MCHC 12/01/2018 32.5  30.0 - 36.0 g/dL Final   RDW 84/13/2440 11.9  11.5 - 15.5 % Final   Platelets  12/01/2018 258  150 - 400 K/uL Final   nRBC 12/01/2018 0.0  0.0 - 0.2 % Final   Performed at [facility], [address] [place], [place] [address]   Sodium 12/01/2018 138  135 - 145 mmol/L Final   Potassium 12/01/2018 3.4* 3.5 - 5.1 mmol/L Final   Chloride 12/01/2018 104  98 - 111 mmol/L Final   CO2 12/01/2018 22  22 - 32 mmol/L Final   Glucose, Bld 12/01/2018 138* 70 - 99 mg/dL Final   BUN 53/66/4403 11  6 - 20 mg/dL Final   Creatinine, Ser 12/01/2018 0.53  0.44 - 1.00 mg/dL Final   Calcium 47/42/5956 8.5* 8.9 - 10.3 mg/dL Final   GFR calc non Af Amer 12/01/2018 >60  >60 mL/min Final   GFR calc Af Amer 12/01/2018 >60  >60 mL/min Final   Anion gap 12/01/2018 12  5 - 15 Final   Performed at [facility], [address] [place], [place] [address]   WBC 12/02/2018 18.3* 4.0 - 10.5 K/uL Final   RBC 12/02/2018 2.75* 3.87 - 5.11 MIL/uL Final   Hemoglobin 12/02/2018 9.0* 12.0 - 15.0 g/dL Final   HCT 43/32/9518 28.5* 36.0 - 46.0 % Final   MCV 12/02/2018 103.6* 80.0 - 100.0 fL Final   MCH 12/02/2018 32.7  26.0 - 34.0 pg Final   MCHC 12/02/2018 31.6  30.0 - 36.0 g/dL Final   RDW 84/16/6063 12.3  11.5 - 15.5 % Final   Platelets 12/02/2018 250  150 - 400  K/uL Final   nRBC 12/02/2018 0.0  0.0 - 0.2 % Final   Performed at [facility], [address] [place], [place] [address]   Sodium 12/02/2018 139  135 - 145 mmol/L Final   Potassium 12/02/2018 3.8  3.5 - 5.1 mmol/L Final   Chloride 12/02/2018 105  98 - 111 mmol/L Final   CO2 12/02/2018 24  22 - 32 mmol/L Final   Glucose, Bld 12/02/2018 137* 70 - 99 mg/dL Final   BUN 60/45/409807/17/2020 13  6 - 20 mg/dL Final   Creatinine, Ser 12/02/2018 0.51  0.44 - 1.00 mg/dL Final   Calcium 11/91/478207/17/2020 8.5* 8.9 - 10.3 mg/dL Final   GFR calc non Af Amer 12/02/2018 >60  >60 mL/min Final   GFR calc Af Amer 12/02/2018 >60  >60 mL/min Final   Anion gap 12/02/2018 10  5 - 15  Final   Performed at [facility], [address] [place], [place] [address]   WBC 12/03/2018 13.1* 4.0 - 10.5 K/uL Final   RBC 12/03/2018 2.72* 3.87 - 5.11 MIL/uL Final   Hemoglobin 12/03/2018 8.8* 12.0 - 15.0 g/dL Final   HCT 30/86/578407/18/2020 28.1* 36.0 - 46.0 % Final   MCV 12/03/2018 103.3* 80.0 - 100.0 fL Final   MCH 12/03/2018 32.4  26.0 - 34.0 pg Final   MCHC 12/03/2018 31.3  30.0 - 36.0 g/dL Final   RDW 69/62/952807/18/2020 12.5  11.5 - 15.5 % Final   Platelets 12/03/2018 262  150 - 400 K/uL Final   nRBC 12/03/2018 0.0  0.0 - 0.2 % Final   Performed at [facility], [address] [place], [place] [address]   Sodium 12/03/2018 136  135 - 145 mmol/L Final   Potassium 12/03/2018 3.6  3.5 - 5.1 mmol/L Final   Chloride 12/03/2018 101  98 - 111 mmol/L Final   CO2 12/03/2018 25  22 - 32 mmol/L Final   Glucose, Bld 12/03/2018 137* 70 - 99 mg/dL Final   BUN 40/10/272507/18/2020 14  6 - 20 mg/dL Final   Creatinine, Ser 12/03/2018 0.48  0.44 - 1.00 mg/dL Final   Calcium 36/64/403407/18/2020 8.3* 8.9 - 10.3 mg/dL Final   GFR calc non Af Amer 12/03/2018 >60  >60 mL/min Final   GFR calc Af Amer 12/03/2018 >60  >60 mL/min Final   Anion gap 12/03/2018 10  5 - 15 Final   Performed at [facility], [address] [place], [place] [address]   WBC 12/04/2018 11.8* 4.0 - 10.5 K/uL Final   RBC 12/04/2018 2.54* 3.87 - 5.11 MIL/uL Final   Hemoglobin 12/04/2018 8.1* 12.0 - 15.0 g/dL Final   HCT 56/38/756407/19/2020 26.4* 36.0 - 46.0 % Final   MCV 12/04/2018 103.9* 80.0 - 100.0 fL Final   MCH 12/04/2018 31.9  26.0 - 34.0 pg Final   MCHC 12/04/2018 30.7  30.0 - 36.0 g/dL Final   RDW 33/29/518807/19/2020 12.0  11.5 - 15.5 % Final   Platelets 12/04/2018 264  150 - 400 K/uL Final   nRBC 12/04/2018 0.2  0.0 - 0.2 % Final   Performed at [facility], [address] [place], [place] [address]  Hospital Outpatient Visit on 11/28/2018  Component Date  Value Ref Range Status   aPTT 11/28/2018 31  24 - 36 seconds Final   Performed at [facility], [address] [place], [place] [address]   WBC 11/28/2018 6.5  4.0 - 10.5 K/uL Final  RBC 11/28/2018 3.82* 3.87 - 5.11 MIL/uL Final   Hemoglobin 11/28/2018 12.4  12.0 - 15.0 g/dL Final   HCT 16/10/960407/13/2020 38.5  36.0 - 46.0 % Final   MCV 11/28/2018 100.8* 80.0 - 100.0 fL Final   MCH 11/28/2018 32.5  26.0 - 34.0 pg Final   MCHC 11/28/2018 32.2  30.0 - 36.0 g/dL Final   RDW 54/09/811907/13/2020 12.5  11.5 - 15.5 % Final   Platelets 11/28/2018 264  150 - 400 K/uL Final   nRBC 11/28/2018 0.0  0.0 - 0.2 % Final   Performed at [facility], [address] [place], [place] [address]   Sodium 11/28/2018 139  135 - 145 mmol/L Final   Potassium 11/28/2018 3.9  3.5 - 5.1 mmol/L Final   Chloride 11/28/2018 100  98 - 111 mmol/L Final   CO2 11/28/2018 25  22 - 32 mmol/L Final   Glucose, Bld 11/28/2018 114* 70 - 99 mg/dL Final   BUN 95/62/130807/13/2020 18  6 - 20 mg/dL Final   Creatinine, Ser 11/28/2018 0.65  0.44 - 1.00 mg/dL Final   Calcium 65/78/469607/13/2020 9.6  8.9 - 10.3 mg/dL Final   Total Protein 29/52/841307/13/2020 7.8  6.5 - 8.1 g/dL Final   Albumin 24/40/102707/13/2020 4.3  3.5 - 5.0 g/dL Final   AST 25/36/644007/13/2020 33  15 - 41 U/L Final   ALT 11/28/2018 39  0 - 44 U/L Final   Alkaline Phosphatase 11/28/2018 84  38 - 126 U/L Final   Total Bilirubin 11/28/2018 0.6  0.3 - 1.2 mg/dL Final   GFR calc non Af Amer 11/28/2018 >60  >60 mL/min Final   GFR calc Af Amer 11/28/2018 >60  >60 mL/min Final   Anion gap 11/28/2018 14  5 - 15 Final   Performed at [facility], [address] [place], [place] [address]   Prothrombin Time 11/28/2018 12.9  11.4 - 15.2 seconds Final   INR 11/28/2018 1.0  0.8 - 1.2 Final   Comment: (NOTE) INR goal varies based on device and disease states. Performed at [facility], [address] [place], [place]  [address]    ABO/RH(D) 11/28/2018 O POS   Final   Antibody Screen 11/28/2018 NEG   Final   Sample Expiration 11/28/2018 12/03/2018,2359   Final   Extend sample reason 11/28/2018    Final                   Value:NO TRANSFUSIONS OR PREGNANCY IN THE PAST 3 MONTHS Performed at [facility], [address] [place], [place] [address]    MRSA, PCR 11/28/2018 NEGATIVE  NEGATIVE Final   Staphylococcus aureus 11/28/2018 NEGATIVE  NEGATIVE Final   Comment: (NOTE) The Xpert SA Assay (FDA approved for NASAL specimens in patients 622 years of age and older), is one component of a comprehensive surveillance program. It is not intended to diagnose infection nor to guide or monitor treatment. Performed at [facility], [address] [place], [place] [address]    ABO/RH(D) 11/28/2018    Final                   Value:O POS Performed at [facility], [address] [place], [place] [address]   Hospital Outpatient Visit on 11/26/2018  Component Date Value Ref Range Status   SARS Coronavirus 2 11/26/2018 NEGATIVE  NEGATIVE Final   Comment: (NOTE) SARS-CoV-2 target nucleic acids are NOT DETECTED. The SARS-CoV-2 RNA  is generally detectable in upper and lower respiratory specimens during the acute phase of infection. Negative results do not preclude SARS-CoV-2 infection, do not rule out co-infections with other pathogens, and should not be used as the sole basis for treatment or other patient management decisions. Negative results must be combined with clinical observations, patient history, and epidemiological information. The expected result is Negative. Fact Sheet for Patients: HairSlick.no Fact Sheet for Healthcare Providers: quierodirigir.com This test is not yet approved or cleared by the [place] FDA and  has been authorized for detection and/or diagnosis of SARS-CoV-2  by FDA under an Emergency Use Authorization (EUA). This EUA will remain  in effect (meaning this test can be used) for the duration of the COVID-19 declaration under Section 56                          4(b)(1) of the Act, 21 U.S.C. section 360bbb-3(b)(1), unless the authorization is terminated or revoked sooner. Performed at [facility], [address], [place], [place] [address]      X-Rays:No results found.  EKG: Orders placed or performed during the hospital encounter of 11/28/18   EKG 12-Lead   EKG 12-Lead     Hospital Course: Keyira Mondesir is a 53 y.o. who was [facility]. They were brought to the operating room on 11/30/2018 and underwent Procedure(s): TOTAL KNEE BILATERAL.  Patient tolerated the procedure well and was later transferred to the recovery room and then to the orthopaedic floor for postoperative care. They were given PO and IV analgesics for pain control following their surgery. They were given 24 hours of postoperative antibiotics of  Anti-infectives (From admission, onward)   Start     Dose/Rate Route Frequency Ordered Stop   11/30/18 1600  ceFAZolin (ANCEF) IVPB 2g/100 mL premix     2 g 200 mL/hr over 30 Minutes Intravenous Every 6 hours 11/30/18 1530 11/30/18 2140   11/30/18 0800  ceFAZolin (ANCEF) IVPB 2g/100 mL premix     2 g 200 mL/hr over 30 Minutes Intravenous On call to O.R. 11/30/18 6045 11/30/18 0959     and started on DVT prophylaxis in the form of Aspirin.   PT and OT were ordered for total joint protocol. Discharge planning consulted to help with postop disposition and equipment needs. Patient had a good night on the evening of surgery. They started to get up OOB with therapy on POD #1. Left knee hemovac was pulled without difficulty. Potassium dropped from 3.9 preoperatively to 3.4, one dose of 40 mEq KCl was ordered. Epidural and foley catheter were continued into day 2. Was hopeful for discharge to [facility] on Monday (POD #5) pending insurance approval. Epidural was removed on POD #2 with a resultant increase in pain. Foley catheter was removed around 6 hours following epidural discontinuation. Dressings were changed and the incisions were clean, dry, and intact with no drainage. On POD #3, DVT prophylaxis regimen was switched from 325 mg ASA BID to Xarelto 10 mg QD. Worked with therapy through the weekend on days three and four. On POD #5, patient was progressing well with therapy. Voiding without difficulty and positive flatus. Patient was denied admission to inpatient rehab by [employer]. It was then decided between the patient and Dr. Lequita Halt that given the Covid-19 pandemic, the safest plan would be for patient to remain in the hospital and continue working with therapy until she was  safe to discharge home with HHPT. Continued working with physical therapy on postoperative days #6-9. Incisions were healing well. Patient felt she was ready for discharge. Continuing to void without difficulty and positive BM. She worked with therapy for one additional session and was meeting her goals. She was discharged to home later that day in stable condition.  Diet: Regular diet Activity: WBAT Follow-up: in 1 week Disposition: Home with HHPT Discharged Condition: stable   Discharge Instructions    Call MD / Call 911   Complete by: As directed    If you experience chest pain or shortness of breath, CALL 911 and be transported to the hospital emergency room.  If you develope a fever above 101 F, pus (white drainage) or increased drainage or redness at the wound, or calf pain, call your surgeon's office.   Change dressing   Complete by: As directed    Change the dressing daily with sterile 4 x 4 inch gauze dressing and apply TED hose.   Constipation Prevention   Complete by: As directed    Drink plenty of fluids.  Prune juice may be helpful.  You may use a stool softener, such as Colace (over the  counter) 100 mg twice a day.  Use MiraLax (over the counter) for constipation as needed.   Diet - low sodium heart healthy   Complete by: As directed    Discharge instructions   Complete by: As directed    Dr. Ollen Gross Total Joint Specialist [facility] [address], [address] [place], [place] [address] [phone]  TOTAL KNEE REPLACEMENT POSTOPERATIVE DIRECTIONS  Knee Rehabilitation, Guidelines Following Surgery  Results after knee surgery are often greatly improved when you follow the exercise, range of motion and muscle strengthening exercises prescribed by your doctor. Safety measures are also important to protect the knee from further injury. Any time any of these exercises cause you to have increased pain or swelling in your knee joint, decrease the amount until you are comfortable again and slowly increase them. If you have problems or questions, call your caregiver or physical therapist for advice.   HOME CARE INSTRUCTIONS  Remove items at home which could result in a fall. This includes throw rugs or furniture in walking pathways.  ICE to the affected knee every three hours for 30 minutes at a time and then as needed for pain and swelling.  Continue to use ice on the knee for pain and swelling from surgery. You may notice swelling that will progress down to the foot and ankle.  This is normal after surgery.  Elevate the leg when you are not up walking on it.   Continue to use the breathing machine which will help keep your temperature down.  It is common for your temperature to cycle up and down following surgery, especially at night when you are not up moving around and exerting yourself.  The breathing machine keeps your lungs expanded and your temperature down. Do not place pillow under knee, focus on keeping the knee straight while resting   DIET You may resume your previous home diet once your are discharged from the hospital.  DRESSING / WOUND CARE / SHOWERING You  may change your dressing 3-5 days after surgery.  Then change the dressing every day with sterile gauze.  Please use good hand washing techniques before changing the dressing.  Do not use any lotions or creams on the incision until instructed by your surgeon. You may start showering once you  are discharged home but do not submerge the incision under water. Just pat the incision dry and apply a dry gauze dressing on daily. Change the surgical dressing daily and reapply a dry dressing each time.  ACTIVITY Walk with your walker as instructed. Use walker as long as suggested by your caregivers. Avoid periods of inactivity such as sitting longer than an hour when not asleep. This helps prevent blood clots.  You may resume a sexual relationship in one month or when given the OK by your doctor.  You may return to work once you are cleared by your doctor.  Do not drive a car for 6 weeks or until released by you surgeon.  Do not drive while taking narcotics.  WEIGHT BEARING Weight bearing as tolerated with assist device (walker, cane, etc) as directed, use it as long as suggested by your surgeon or therapist, typically at least 4-6 weeks.  POSTOPERATIVE CONSTIPATION PROTOCOL Constipation - defined medically as fewer than three stools per week and severe constipation as less than one stool per week.  One of the most common issues patients have following surgery is constipation.  Even if you have a regular bowel pattern at home, your normal regimen is likely to be disrupted due to multiple reasons following surgery.  Combination of anesthesia, postoperative narcotics, change in appetite and fluid intake all can affect your bowels.  In order to avoid complications following surgery, here are some recommendations in order to help you during your recovery period.  Colace (docusate) - Pick up an over-the-counter form of Colace or another stool softener and take twice a day as long as you are requiring  postoperative pain medications.  Take with a full glass of water daily.  If you experience loose stools or diarrhea, hold the colace until you stool forms back up.  If your symptoms do not get better within 1 week or if they get worse, check with your doctor.  Dulcolax (bisacodyl) - Pick up over-the-counter and take as directed by the product packaging as needed to assist with the movement of your bowels.  Take with a full glass of water.  Use this product as needed if not relieved by Colace only.   MiraLax (polyethylene glycol) - Pick up over-the-counter to have on hand.  MiraLax is a solution that will increase the amount of water in your bowels to assist with bowel movements.  Take as directed and can mix with a glass of water, juice, soda, coffee, or tea.  Take if you go more than two days without a movement. Do not use MiraLax more than once per day. Call your doctor if you are still constipated or irregular after using this medication for 7 days in a row.  If you continue to have problems with postoperative constipation, please contact the office for further assistance and recommendations.  If you experience "the worst abdominal pain ever" or develop nausea or vomiting, please contact the office immediatly for further recommendations for treatment.  ITCHING  If you experience itching with your medications, try taking only a single pain pill, or even half a pain pill at a time.  You can also use Benadryl over the counter for itching or also to help with sleep.   TED HOSE STOCKINGS Wear the elastic stockings on both legs for three weeks following surgery during the day but you may remove then at night for sleeping.  MEDICATIONS See your medication summary on the "After Visit Summary" that the nursing staff will  review with you prior to discharge.  You may have some home medications which will be placed on hold until you complete the course of blood thinner medication.  It is important for you to  complete the blood thinner medication as prescribed by your surgeon.  Continue your approved medications as instructed at time of discharge.  PRECAUTIONS If you experience chest pain or shortness of breath - call 911 immediately for transfer to the hospital emergency department.  If you develop a fever greater that 101 F, purulent drainage from wound, increased redness or drainage from wound, foul odor from the wound/dressing, or calf pain - CONTACT YOUR SURGEON.                                                   FOLLOW-UP APPOINTMENTS Make sure you keep all of your appointments after your operation with your surgeon and caregivers. You should call the office at the above phone number and make an appointment for approximately two weeks after the date of your surgery or on the date instructed by your surgeon outlined in the "After Visit Summary".   RANGE OF MOTION AND STRENGTHENING EXERCISES  Rehabilitation of the knee is important following a knee injury or an operation. After just a few days of immobilization, the muscles of the thigh which control the knee become weakened and shrink (atrophy). Knee exercises are designed to build up the tone and strength of the thigh muscles and to improve knee motion. Often times heat used for twenty to thirty minutes before working out will loosen up your tissues and help with improving the range of motion but do not use heat for the first two weeks following surgery. These exercises can be done on a training (exercise) mat, on the floor, on a table or on a bed. Use what ever works the best and is most comfortable for you Knee exercises include:  Leg Lifts - While your knee is still immobilized in a splint or cast, you can do straight leg raises. Lift the leg to 60 degrees, hold for 3 sec, and slowly lower the leg. Repeat 10-20 times 2-3 times daily. Perform this exercise against resistance later as your knee gets better.  Quad and Hamstring Sets - Tighten up the  muscle on the front of the thigh (Quad) and hold for 5-10 sec. Repeat this 10-20 times hourly. Hamstring sets are done by pushing the foot backward against an object and holding for 5-10 sec. Repeat as with quad sets.  Leg Slides: Lying on your back, slowly slide your foot toward your buttocks, bending your knee up off the floor (only go as far as is comfortable). Then slowly slide your foot back down until your leg is flat on the floor again. Angel Wings: Lying on your back spread your legs to the side as far apart as you can without causing discomfort.  A rehabilitation program following serious knee injuries can speed recovery and prevent re-injury in the future due to weakened muscles. Contact your doctor or a physical therapist for more information on knee rehabilitation.   IF YOU ARE TRANSFERRED TO A SKILLED REHAB FACILITY If the patient is transferred to a skilled rehab facility following release from the hospital, a list of the current medications will be sent to the facility for the patient to continue.  When discharged  from the skilled rehab facility, please have the facility set up the patient's Rosa prior to being released. Also, the skilled facility will be responsible for providing the patient with their medications at time of release from the facility to include their pain medication, the muscle relaxants, and their blood thinner medication. If the patient is still at the rehab facility at time of the two week follow up appointment, the skilled rehab facility will also need to assist the patient in arranging follow up appointment in our office and any transportation needs.  MAKE SURE YOU:  Understand these instructions.  Get help right away if you are not doing well or get worse.    Pick up stool softner and laxative for home use following surgery while on pain medications. Do not submerge incision under water. Please use good hand washing techniques while  changing dressing each day. May shower starting three days after surgery. Please use a clean towel to pat the incision dry following showers. Continue to use ice for pain and swelling after surgery. Do not use any lotions or creams on the incision until instructed by your surgeon.   Do not put a pillow under the knee. Place it under the heel.   Complete by: As directed    Driving restrictions   Complete by: As directed    No driving for two weeks   TED hose   Complete by: As directed    Use stockings (TED hose) for three weeks on both leg(s).  You may remove them at night for sleeping.   Weight bearing as tolerated   Complete by: As directed      Allergies as of 12/09/2018      Reactions   Neosporin [neomycin-bacitracin Zn-polymyx] Dermatitis   Eats up her skin   Codeine Nausea And Vomiting      Medication List    STOP taking these medications   Black Cohosh 540 MG Caps   Fish Oil 1200 MG Caps   ibuprofen 200 MG tablet Commonly known as: ADVIL   Magnesium 500 MG Tabs   niacin 250 MG tablet   OVER THE COUNTER MEDICATION   POTASSIUM CITRATE PO     TAKE these medications   acetaminophen 500 MG tablet Commonly known as: TYLENOL Take 1,000 mg by mouth every 6 (six) hours as needed for moderate pain.   gabapentin 300 MG capsule Commonly known as: NEURONTIN Take 1 capsule (300 mg total) by mouth 3 (three) times daily. Take a 300 mg capsule three times a day for two weeks following surgery.Then take a 300 mg capsule two times a day for two weeks. Then take a 300 mg capsule once a day for two weeks. Then discontinue.   HYDROmorphone 2 MG tablet Commonly known as: DILAUDID Take 1-2 tablets (2-4 mg total) by mouth every 6 (six) hours as needed for severe pain. Not to exceed 6 tablets a day.   lisinopril-hydrochlorothiazide 20-25 MG tablet Commonly known as: ZESTORETIC Take 1 tablet by mouth daily.   methocarbamol 500 MG tablet Commonly known as: ROBAXIN Take 1 tablet  (500 mg total) by mouth every 6 (six) hours as needed for muscle spasms.   omeprazole 20 MG capsule Commonly known as: PRILOSEC Take 20 mg by mouth daily.   rivaroxaban 10 MG Tabs tablet Commonly known as: XARELTO Take 1 tablet (10 mg total) by mouth daily. Then take one 81 mg aspirin once a day for three weeks. Then discontinue aspirin.  Discharge Care Instructions  (From admission, onward)         Start     Ordered   12/01/18 0000  Weight bearing as tolerated     12/01/18 0739   12/01/18 0000  Change dressing    Comments: Change the dressing daily with sterile 4 x 4 inch gauze dressing and apply TED hose.   12/01/18 0739         Follow-up Information    Ollen Gross, MD. Schedule an appointment as soon as possible for a visit on 12/15/2018.   Specialty: Orthopedic Surgery Contact information: [address] [place] [address] [phone]        [facility] Follow up.   Specialty: [facility] Contact information: [address] [address] [place] [address] [phone]           Signed: Arther Abbott, PA-C Orthopedic Surgery 12/12/2018, 9:37 AM

## 2019-09-06 NOTE — H&P (Signed)
TOTAL KNEE REVISION ADMISSION H&P  Patient is being admitted for left total knee polyethylene revision.  Subjective:  Chief Complaint:left knee pain and instability s/p left total knee arthroplasty.  HPI: Joanna Barber, 54 y.o. female, has a history of pain and functional disability in the left knee(s) due to failed previous arthroplasty and patient has failed non-surgical conservative treatments for greater than 12 weeks to include supervised PT with diminished ADL's post treatment, use of assistive devices and activity modification. The indications for the revision of the total knee arthroplasty are  gradually worsening instability and pain . Onset of symptoms was gradual starting  several  months ago with gradually worsening course since that time.  Prior procedures on the left knee(s) include arthroplasty.  Patient currently rates pain in the left knee(s) at 6 out of 10 with activity. There is worsening of pain with activity and weight bearing and instability .  Patient has evidence of  left knee prosthesis in great position with no periprosthetic abnormalities or signs of loosening  by imaging studies. This condition presents safety issues increasing the risk of falls. There is no current active infection.  Patient Active Problem List   Diagnosis Date Noted   OA (osteoarthritis) of knee 11/30/2018   Past Medical History:  Diagnosis Date   Arthritis    bi lat knees   Hypertension     Past Surgical History:  Procedure Laterality Date   BACK SURGERY  2014   for spinal stenosis   CESAREAN SECTION     KNEE ARTHROSCOPY Left    TOTAL KNEE ARTHROPLASTY Bilateral 11/30/2018   Procedure: TOTAL KNEE BILATERAL;  Surgeon: Ollen Gross, MD;  Location: [facility];  Service: Orthopedics;  Laterality: Bilateral;     No current facility-administered medications for this encounter.   Current Outpatient Medications  Medication Sig Dispense Refill Last Dose   acetaminophen (TYLENOL) 500 MG  tablet Take 1,000 mg by mouth every 6 (six) hours as needed for moderate pain.      gabapentin (NEURONTIN) 300 MG capsule Take 1 capsule (300 mg total) by mouth 3 (three) times daily. Take a 300 mg capsule three times a day for two weeks following surgery.Then take a 300 mg capsule two times a day for two weeks. Then take a 300 mg capsule once a day for two weeks. Then discontinue. 84 capsule 0    HYDROmorphone (DILAUDID) 2 MG tablet Take 1-2 tablets (2-4 mg total) by mouth every 6 (six) hours as needed for severe pain. Not to exceed 6 tablets a day. 42 tablet 0    lisinopril-hydrochlorothiazide (ZESTORETIC) 20-25 MG tablet Take 1 tablet by mouth daily.      methocarbamol (ROBAXIN) 500 MG tablet Take 1 tablet (500 mg total) by mouth every 6 (six) hours as needed for muscle spasms. 40 tablet 0    omeprazole (PRILOSEC) 20 MG capsule Take 20 mg by mouth daily.      rivaroxaban (XARELTO) 10 MG TABS tablet Take 1 tablet (10 mg total) by mouth daily. Then take one 81 mg aspirin once a day for three weeks. Then discontinue aspirin. 18 tablet 0    Allergies  Allergen Reactions   Neosporin [Neomycin-Bacitracin Zn-Polymyx] Dermatitis    Eats up her skin    Codeine Nausea And Vomiting    Social History   Tobacco Use   Smoking status: Former Smoker    Packs/day: 1.00    Years: 15.00    Pack years: 15.00    Types:  Cigarettes    Quit date: 2010    Years since quitting: 11.3   Smokeless tobacco: Never Used  Substance Use Topics   Alcohol use: Yes    Alcohol/week: 3.0 - 4.0 standard drinks    Types: 3 - 4 Standard drinks or equivalent per week    No family history on file.    Review of Systems  Constitutional: Negative for chills and fever.  HENT: Negative for congestion, sore throat and tinnitus.   Eyes: Negative for photophobia and pain.  Respiratory: Negative for cough, shortness of breath and wheezing.   Cardiovascular: Negative for chest pain and palpitations.  Gastrointestinal: Negative  for nausea and vomiting.  Genitourinary: Negative for dysuria, frequency and urgency.  Neurological: Negative for dizziness, weakness and headaches.     Objective:  Physical Exam  Alert and oriented, no acute distress  Left Knee Exam: Well healed arthroplasty scar. AROM 0-125 degrees without discomfort. No lateral instability with the knee in 30 degrees of flexion or full extension.  Mild AP play. No effusion, swelling, or erythema.  Sensation to light touch and motor function intact in LE. Distal pulses 2+. Calves soft and nontender.  Labs:  Estimated body mass index is 34.06 kg/m as calculated from the following:   Height as of 11/30/18: 5\' 2"  (1.575 m).   Weight as of 11/30/18: 84.5 kg.  Imaging Review Plain radiographs demonstrate the left knee prosthesis in good position with no periprosthetic abnormalities. No evidence of loosening.  Assessment/Plan:  Unstable left total knee arthroplasty  The patient history, physical examination, clinical judgment of the provider and imaging studies are consistent with unstable left total knee arthroplasty. Revision polyethylene total knee arthroplasty is deemed medically necessary. The treatment options including medical management, injection therapy, arthroscopy and revision arthroplasty were discussed at length. The risks and benefits of revision total knee arthroplasty were presented and reviewed. The risks due to aseptic loosening, infection, stiffness, patella tracking problems, thromboembolic complications and other imponderables were discussed. The patient acknowledged the explanation, agreed to proceed with the plan and consent was signed. Patient is being admitted for inpatient treatment for surgery, pain control, PT, OT, prophylactic antibiotics, VTE prophylaxis, progressive ambulation and ADL's and discharge planning.The patient is planning to be discharged  home .  Therapy Plans: Outpatient therapy at [facility] in [facility]  Disposition: Home with husband and daughter Planned DVT Prophylaxis: Aspirin 325 mg BID DME Needed: None PCP: Doug Sou, DO TXA: IV Allergies: Codeine (N/V) Anesthesia Concerns: None BMI: 33.7  - Patient was instructed on what medications to stop prior to surgery. - Follow-up visit in 2 weeks with Dr. Wynelle Link - Begin physical therapy following surgery - Pre-operative lab work as pre-surgical testing - Prescriptions will be provided in hospital at time of discharge  Theresa Duty, PA-C Orthopedic Surgery [facility] [place]

## 2019-09-15 NOTE — Progress Notes (Signed)
PCP - Hope Pigeon, MD Cardiologist -   Chest x-ray -  EKG -  Stress Test -  ECHO -  Cardiac Cath -   Sleep Study -  CPAP -   Fasting Blood Sugar -  Checks Blood Sugar _____ times a day  Blood Thinner Instructions: Aspirin Instructions: Last Dose:  Anesthesia review:   Patient denies shortness of breath, fever, cough and chest pain at PAT appointment   Patient verbalized understanding of instructions that were given to them at the PAT appointment. Patient was also instructed that they will need to review over the PAT instructions again at home before surgery.

## 2019-09-15 NOTE — Patient Instructions (Addendum)
DUE TO COVID-19 ONLY ONE VISITOR IS ALLOWED TO COME WITH YOU AND STAY IN THE WAITING ROOM ONLY DURING PRE OP AND PROCEDURE DAY OF SURGERY. THE 1 VISITOR MAY VISIT WITH YOU AFTER SURGERY IN YOUR PRIVATE ROOM DURING VISITING HOURS ONLY!  YOU NEED TO HAVE A COVID 19 TEST ON 09-23-19 @ __0900_____, THIS TEST MUST BE DONE BEFORE SURGERY, COME  [place], [place] , [id number].  ([place]) ONCE YOUR COVID TEST IS COMPLETED, PLEASE BEGIN THE QUARANTINE INSTRUCTIONS AS OUTLINED IN YOUR HANDOUT.                Joanna Barber  09/15/2019   Your procedure is scheduled on: 09-27-19   Report to [facility] Main  Entrance    Report to admitting at 12:10 PM     Call this number if you have problems the morning of surgery [phone]    Remember: NO SOLID FOOD AFTER MIDNIGHT THE NIGHT PRIOR TO SURGERY. NOTHING BY MOUTH EXCEPT CLEAR LIQUIDS UNTIL 11:40 AM. PLEASE FINISH ENSURE DRINK PER SURGEON ORDER  WHICH NEEDS TO BE COMPLETED AT 11:40 AM .   CLEAR LIQUID DIET   Foods Allowed                                                                     Foods Excluded  Coffee and tea, regular and decaf                             liquids that you cannot  Plain Jell-O any favor except red or purple                                           see through such as: Fruit ices (not with fruit pulp)                                     milk, soups, orange juice  Iced Popsicles                                    All solid food Carbonated beverages, regular and diet                                    Cranberry, grape and apple juices Sports drinks like Gatorade Lightly seasoned clear broth or consume(fat free) Sugar, honey syrup   _____________________________________________________________________     Take these medicines the morning of surgery with A SIP OF WATER: Gabapentin (Neurontin), and Omeprazole (Prilosec)  BRUSH YOUR TEETH MORNING OF SURGERY AND RINSE YOUR MOUTH  OUT, NO CHEWING GUM CANDY OR MINTS.                             You may not have any metal on your body including hair pins and  piercings    Do not wear jewelry, make-up, lotions, powders or perfumes, deodorant             Do not wear nail polish on your fingernails.  Do not shave  48 hours prior to surgery.          Do not bring valuables to the hospital. [facility] IS NOT             RESPONSIBLE   FOR VALUABLES.  Contacts, dentures or bridgework may not be worn into surgery.  You may bring small overnight bag   Special Instructions: N/A              Please read over the following fact sheets you were given: _____________________________________________________________________             [facility] - Preparing for Surgery Before surgery, you can play an important role.  Because skin is not sterile, your skin needs to be as free of germs as possible.  You can reduce the number of germs on your skin by washing with CHG (chlorahexidine gluconate) soap before surgery.  CHG is an antiseptic cleaner which kills germs and bonds with the skin to continue killing germs even after washing. Please DO NOT use if you have an allergy to CHG or antibacterial soaps.  If your skin becomes reddened/irritated stop using the CHG and inform your nurse when you arrive at Short Stay. Do not shave (including legs and underarms) for at least 48 hours prior to the first CHG shower.  You may shave your face/neck. Please follow these instructions carefully:  1.  Shower with CHG Soap the night before surgery and the  morning of Surgery.  2.  If you choose to wash your hair, wash your hair first as usual with your  normal  shampoo.  3.  After you shampoo, rinse your hair and body thoroughly to remove the  shampoo.                           4.  Use CHG as you would any other liquid soap.  You can apply chg directly  to the skin and wash                       Gently with a scrungie or clean  washcloth.  5.  Apply the CHG Soap to your body ONLY FROM THE NECK DOWN.   Do not use on face/ open                           Wound or open sores. Avoid contact with eyes, ears mouth and genitals (private parts).                       Wash face,  Genitals (private parts) with your normal soap.             6.  Wash thoroughly, paying special attention to the area where your surgery  will be performed.  7.  Thoroughly rinse your body with warm water from the neck down.  8.  DO NOT shower/wash with your normal soap after using and rinsing off  the CHG Soap.                9.  Pat yourself dry with a clean towel.  10.  Wear clean pajamas.            11.  Place clean sheets on your bed the night of your first shower and do not  sleep with pets. Day of Surgery : Do not apply any lotions/deodorants the morning of surgery.  Please wear clean clothes to the hospital/surgery center.  FAILURE TO FOLLOW THESE INSTRUCTIONS MAY RESULT IN THE CANCELLATION OF YOUR SURGERY PATIENT SIGNATURE_________________________________  NURSE SIGNATURE__________________________________  ________________________________________________________________________   Joanna Barber  An incentive spirometer is a tool that can help keep your lungs clear and active. This tool measures how well you are filling your lungs with each breath. Taking long deep breaths may help reverse or decrease the chance of developing breathing (pulmonary) problems (especially infection) following:  A long period of time when you are unable to move or be active. BEFORE THE PROCEDURE   If the spirometer includes an indicator to show your best effort, your nurse or respiratory therapist will set it to a desired goal.  If possible, sit up straight or lean slightly forward. Try not to slouch.  Hold the incentive spirometer in an upright position. INSTRUCTIONS FOR USE  1. Sit on the edge of your bed if possible, or sit up as far  as you can in bed or on a chair. 2. Hold the incentive spirometer in an upright position. 3. Breathe out normally. 4. Place the mouthpiece in your mouth and seal your lips tightly around it. 5. Breathe in slowly and as deeply as possible, raising the piston or the ball toward the top of the column. 6. Hold your breath for 3-5 seconds or for as long as possible. Allow the piston or ball to fall to the bottom of the column. 7. Remove the mouthpiece from your mouth and breathe out normally. 8. Rest for a few seconds and repeat Steps 1 through 7 at least 10 times every 1-2 hours when you are awake. Take your time and take a few normal breaths between deep breaths. 9. The spirometer may include an indicator to show your best effort. Use the indicator as a goal to work toward during each repetition. 10. After each set of 10 deep breaths, practice coughing to be sure your lungs are clear. If you have an incision (the cut made at the time of surgery), support your incision when coughing by placing a pillow or rolled up towels firmly against it. Once you are able to get out of bed, walk around indoors and cough well. You may stop using the incentive spirometer when instructed by your caregiver.  RISKS AND COMPLICATIONS  Take your time so you do not get dizzy or light-headed.  If you are in pain, you may need to take or ask for pain medication before doing incentive spirometry. It is harder to take a deep breath if you are having pain. AFTER USE  Rest and breathe slowly and easily.  It can be helpful to keep track of a log of your progress. Your caregiver can provide you with a simple table to help with this. If you are using the spirometer at home, follow these instructions: [place] IF:   You are having difficultly using the spirometer.  You have trouble using the spirometer as often as instructed.  Your pain medication is not giving enough relief while using the spirometer.  You  develop fever of 100.5 F (38.1 C) or higher. SEEK IMMEDIATE MEDICAL CARE IF:   You cough up bloody  sputum that had not been present before.  You develop fever of 102 F (38.9 C) or greater.  You develop worsening pain at or near the incision site. MAKE SURE YOU:   Understand these instructions.  Will watch your condition.  Will get help right away if you are not doing well or get worse. Document Released: 09/14/2006 Document Revised: 07/27/2011 Document Reviewed: 11/15/2006 [facility] Patient Information 2014 [place], [place].   ________________________________________________________________________

## 2019-09-18 ENCOUNTER — Encounter (HOSPITAL_COMMUNITY): Payer: Self-pay

## 2019-09-18 ENCOUNTER — Other Ambulatory Visit: Payer: Self-pay

## 2019-09-18 ENCOUNTER — Encounter (HOSPITAL_COMMUNITY)
Admission: RE | Admit: 2019-09-18 | Discharge: 2019-09-18 | Disposition: A | Discharge status: Home / Self Care | Payer: 59 | Source: Ambulatory Visit | Attending: Anesthesiology | Admitting: Anesthesiology

## 2019-09-18 HISTORY — DX: Gastro-esophageal reflux disease without esophagitis: K21.9

## 2019-09-19 ENCOUNTER — Encounter (HOSPITAL_COMMUNITY)
Admission: RE | Admit: 2019-09-19 | Discharge: 2019-09-19 | Disposition: A | Discharge status: Home / Self Care | Payer: 59 | Source: Ambulatory Visit | Attending: Orthopedic Surgery | Admitting: Orthopedic Surgery

## 2019-09-19 DIAGNOSIS — I119 Hypertensive heart disease without heart failure: Secondary | ICD-10-CM | POA: Insufficient documentation

## 2019-09-19 DIAGNOSIS — Z01818 Encounter for other preprocedural examination: Secondary | ICD-10-CM | POA: Insufficient documentation

## 2019-09-19 DIAGNOSIS — R9431 Abnormal electrocardiogram [ECG] [EKG]: Secondary | ICD-10-CM | POA: Diagnosis not present

## 2019-09-19 LAB — COMPREHENSIVE METABOLIC PANEL
ALT: 28 U/L (ref 0–44)
AST: 26 U/L (ref 15–41)
Albumin: 4.3 g/dL (ref 3.5–5.0)
Alkaline Phosphatase: 88 U/L (ref 38–126)
Anion gap: 10 (ref 5–15)
BUN: 16 mg/dL (ref 6–20)
CO2: 28 mmol/L (ref 22–32)
Calcium: 9.2 mg/dL (ref 8.9–10.3)
Chloride: 105 mmol/L (ref 98–111)
Creatinine, Ser: 0.66 mg/dL (ref 0.44–1.00)
GFR calc Af Amer: >60 mL/min (ref >60)
GFR calc non Af Amer: >60 mL/min (ref >60)
Glucose, Bld: 135 mg/dL — ABNORMAL HIGH (ref 70–99)
Potassium: 3.4 mmol/L — ABNORMAL LOW (ref 3.5–5.1)
Sodium: 143 mmol/L (ref 135–145)
Total Bilirubin: 0.6 mg/dL (ref 0.3–1.2)
Total Protein: 7.7 g/dL (ref 6.5–8.1)

## 2019-09-19 LAB — APTT: aPTT: 30 seconds (ref 24–36)

## 2019-09-19 LAB — CBC
HCT: 38.5 % (ref 36.0–46.0)
Hemoglobin: 12.5 g/dL (ref 12.0–15.0)
MCH: 31.6 pg (ref 26.0–34.0)
MCHC: 32.5 g/dL (ref 30.0–36.0)
MCV: 97.2 fL (ref 80.0–100.0)
Platelets: 287 10*3/uL (ref 150–400)
RBC: 3.96 MIL/uL (ref 3.87–5.11)
RDW: 12.1 % (ref 11.5–15.5)
WBC: 8 10*3/uL (ref 4.0–10.5)
nRBC: 0 % (ref 0.0–0.2)

## 2019-09-19 LAB — SURGICAL PCR SCREEN
MRSA, PCR: NEGATIVE
Staphylococcus aureus: POSITIVE — AB

## 2019-09-19 LAB — PROTIME-INR
INR: 0.9 (ref 0.8–1.2)
Prothrombin Time: 11.9 seconds (ref 11.4–15.2)

## 2019-09-23 ENCOUNTER — Other Ambulatory Visit (HOSPITAL_COMMUNITY)
Admission: RE | Admit: 2019-09-23 | Discharge: 2019-09-23 | Disposition: A | Discharge status: Home / Self Care | Payer: 59 | Source: Ambulatory Visit | Attending: Orthopedic Surgery | Admitting: Orthopedic Surgery

## 2019-09-23 DIAGNOSIS — Z01812 Encounter for preprocedural laboratory examination: Secondary | ICD-10-CM | POA: Diagnosis present

## 2019-09-23 DIAGNOSIS — Z20822 Contact with and (suspected) exposure to covid-19: Secondary | ICD-10-CM | POA: Diagnosis not present

## 2019-09-23 LAB — SARS CORONAVIRUS 2 (TAT 6-24 HRS): SARS Coronavirus 2: NEGATIVE

## 2019-09-26 MED ORDER — BUPIVACAINE LIPOSOME 1.3 % IJ SUSP
20.0000 mL | Freq: Once | INTRAMUSCULAR | Status: DC
  Filled 2019-09-26: qty 20

## 2019-09-27 ENCOUNTER — Ambulatory Visit (HOSPITAL_COMMUNITY): Payer: 59 | Admitting: Certified Registered"

## 2019-09-27 ENCOUNTER — Ambulatory Visit (HOSPITAL_COMMUNITY)
Admission: RE | Admit: 2019-09-27 | Discharge: 2019-09-28 | Disposition: A | Discharge status: Home / Self Care | Payer: 59 | Attending: Orthopedic Surgery | Admitting: Orthopedic Surgery

## 2019-09-27 ENCOUNTER — Encounter (HOSPITAL_COMMUNITY)
Admission: RE | Disposition: A | Discharge status: Home / Self Care | Payer: Self-pay | Source: Home / Self Care | Attending: Orthopedic Surgery

## 2019-09-27 ENCOUNTER — Other Ambulatory Visit: Payer: Self-pay

## 2019-09-27 ENCOUNTER — Encounter (HOSPITAL_COMMUNITY): Payer: Self-pay | Admitting: Orthopedic Surgery

## 2019-09-27 DIAGNOSIS — Z79899 Other long term (current) drug therapy: Secondary | ICD-10-CM | POA: Diagnosis not present

## 2019-09-27 DIAGNOSIS — Z96659 Presence of unspecified artificial knee joint: Secondary | ICD-10-CM

## 2019-09-27 DIAGNOSIS — T84018A Broken internal joint prosthesis, other site, initial encounter: Secondary | ICD-10-CM

## 2019-09-27 DIAGNOSIS — Y792 Prosthetic and other implants, materials and accessory orthopedic devices associated with adverse incidents: Secondary | ICD-10-CM | POA: Insufficient documentation

## 2019-09-27 DIAGNOSIS — I10 Essential (primary) hypertension: Secondary | ICD-10-CM | POA: Insufficient documentation

## 2019-09-27 DIAGNOSIS — K219 Gastro-esophageal reflux disease without esophagitis: Secondary | ICD-10-CM | POA: Insufficient documentation

## 2019-09-27 DIAGNOSIS — Z96653 Presence of artificial knee joint, bilateral: Secondary | ICD-10-CM | POA: Diagnosis not present

## 2019-09-27 DIAGNOSIS — Z87891 Personal history of nicotine dependence: Secondary | ICD-10-CM | POA: Diagnosis not present

## 2019-09-27 DIAGNOSIS — T84093A Other mechanical complication of internal left knee prosthesis, initial encounter: Secondary | ICD-10-CM

## 2019-09-27 DIAGNOSIS — T84023A Instability of internal left knee prosthesis, initial encounter: Secondary | ICD-10-CM | POA: Diagnosis not present

## 2019-09-27 HISTORY — PX: TOTAL KNEE REVISION: SHX996

## 2019-09-27 LAB — TYPE AND SCREEN
ABO/RH(D): O POS
Antibody Screen: NEGATIVE

## 2019-09-27 SURGERY — TOTAL KNEE REVISION
Anesthesia: Spinal | Site: Knee | Laterality: Left

## 2019-09-27 MED ORDER — CEFAZOLIN SODIUM-DEXTROSE 2-4 GM/100ML-% IV SOLN
2.0000 g | INTRAVENOUS | Status: AC
  Administered 2019-09-27: 10:00:00 2 g via INTRAVENOUS

## 2019-09-27 MED ORDER — ONDANSETRON HCL 4 MG/2ML IJ SOLN
INTRAMUSCULAR | Status: AC
  Filled 2019-09-27: qty 2

## 2019-09-27 MED ORDER — METOCLOPRAMIDE HCL 5 MG PO TABS: 5.0000 mg | ORAL_TABLET | Freq: Three times a day (TID) | ORAL | Status: DC | PRN

## 2019-09-27 MED ORDER — HYDROMORPHONE HCL 2 MG PO TABS: 2.0000 mg | ORAL_TABLET | Freq: Four times a day (QID) | ORAL | 0 refills | Status: AC | PRN

## 2019-09-27 MED ORDER — POVIDONE-IODINE 10 % EX SWAB
2.0000 "application " | Freq: Once | CUTANEOUS | Status: AC
  Administered 2019-09-27: 2 via TOPICAL

## 2019-09-27 MED ORDER — FLEET ENEMA 7-19 GM/118ML RE ENEM: 1.0000 | ENEMA | Freq: Once | RECTAL | Status: DC | PRN

## 2019-09-27 MED ORDER — ASPIRIN EC 325 MG PO TBEC
325.0000 mg | DELAYED_RELEASE_TABLET | Freq: Two times a day (BID) | ORAL | Status: DC
  Administered 2019-09-28: 325 mg via ORAL
  Filled 2019-09-27: qty 1

## 2019-09-27 MED ORDER — DOCUSATE SODIUM 100 MG PO CAPS
100.0000 mg | ORAL_CAPSULE | Freq: Two times a day (BID) | ORAL | Status: DC
  Administered 2019-09-27 – 2019-09-28 (×3): 100 mg via ORAL
  Filled 2019-09-27 (×3): qty 1

## 2019-09-27 MED ORDER — BISACODYL 10 MG RE SUPP: 10.0000 mg | Freq: Every day | RECTAL | Status: DC | PRN

## 2019-09-27 MED ORDER — 0.9 % SODIUM CHLORIDE (POUR BTL) OPTIME
TOPICAL | Status: DC | PRN
  Administered 2019-09-27: 10:00:00 1000 mL

## 2019-09-27 MED ORDER — PROPOFOL 10 MG/ML IV BOLUS
INTRAVENOUS | Status: AC
  Filled 2019-09-27: qty 20

## 2019-09-27 MED ORDER — PROPOFOL 500 MG/50ML IV EMUL
INTRAVENOUS | Status: DC | PRN
  Administered 2019-09-27: 75 ug/kg/min via INTRAVENOUS

## 2019-09-27 MED ORDER — ONDANSETRON HCL 4 MG/2ML IJ SOLN
INTRAMUSCULAR | Status: DC | PRN
  Administered 2019-09-27: 4 mg via INTRAVENOUS

## 2019-09-27 MED ORDER — CEFAZOLIN SODIUM-DEXTROSE 2-4 GM/100ML-% IV SOLN
INTRAVENOUS | Status: AC
  Filled 2019-09-27: qty 100

## 2019-09-27 MED ORDER — METHOCARBAMOL 500 MG PO TABS
500.0000 mg | ORAL_TABLET | Freq: Four times a day (QID) | ORAL | Status: DC | PRN
  Administered 2019-09-27 – 2019-09-28 (×3): 500 mg via ORAL
  Filled 2019-09-27 (×3): qty 1

## 2019-09-27 MED ORDER — MIDAZOLAM HCL 2 MG/2ML IJ SOLN
INTRAMUSCULAR | Status: AC
  Administered 2019-09-27: 09:00:00 2 mg via INTRAVENOUS
  Filled 2019-09-27: qty 2

## 2019-09-27 MED ORDER — MENTHOL 3 MG MT LOZG: 1.0000 | LOZENGE | OROMUCOSAL | Status: DC | PRN

## 2019-09-27 MED ORDER — MEPIVACAINE HCL (PF) 2 % IJ SOLN
INTRAMUSCULAR | Status: DC | PRN
  Administered 2019-09-27: 3 mL via INTRATHECAL

## 2019-09-27 MED ORDER — DEXAMETHASONE SODIUM PHOSPHATE 10 MG/ML IJ SOLN: 10.0000 mg | Freq: Once | INTRAMUSCULAR | Status: DC

## 2019-09-27 MED ORDER — BUPIVACAINE HCL (PF) 0.25 % IJ SOLN
INTRAMUSCULAR | Status: AC
  Filled 2019-09-27: qty 30

## 2019-09-27 MED ORDER — SODIUM CHLORIDE 0.9 % IR SOLN
Status: DC | PRN
  Administered 2019-09-27: 1000 mL

## 2019-09-27 MED ORDER — FENTANYL CITRATE (PF) 100 MCG/2ML IJ SOLN: 50.0000 ug | INTRAMUSCULAR | Status: DC

## 2019-09-27 MED ORDER — SODIUM CHLORIDE (PF) 0.9 % IJ SOLN
INTRAMUSCULAR | Status: AC
  Filled 2019-09-27: qty 50

## 2019-09-27 MED ORDER — LACTATED RINGERS IV SOLN: INTRAVENOUS | Status: DC

## 2019-09-27 MED ORDER — GABAPENTIN 300 MG PO CAPS
300.0000 mg | ORAL_CAPSULE | Freq: Two times a day (BID) | ORAL | Status: DC
  Administered 2019-09-27 – 2019-09-28 (×2): 300 mg via ORAL
  Filled 2019-09-27 (×3): qty 1

## 2019-09-27 MED ORDER — LIDOCAINE 2% (20 MG/ML) 5 ML SYRINGE
INTRAMUSCULAR | Status: AC
  Filled 2019-09-27: qty 5

## 2019-09-27 MED ORDER — DEXAMETHASONE SODIUM PHOSPHATE 10 MG/ML IJ SOLN
INTRAMUSCULAR | Status: AC
  Filled 2019-09-27: qty 1

## 2019-09-27 MED ORDER — METOCLOPRAMIDE HCL 5 MG/ML IJ SOLN: 5.0000 mg | Freq: Three times a day (TID) | INTRAMUSCULAR | Status: DC | PRN

## 2019-09-27 MED ORDER — PHENOL 1.4 % MT LIQD: 1.0000 | OROMUCOSAL | Status: DC | PRN

## 2019-09-27 MED ORDER — LIDOCAINE 2% (20 MG/ML) 5 ML SYRINGE
INTRAMUSCULAR | Status: DC | PRN
  Administered 2019-09-27: 40 mg via INTRAVENOUS

## 2019-09-27 MED ORDER — PHENYLEPHRINE HCL (PRESSORS) 10 MG/ML IV SOLN
INTRAVENOUS | Status: AC
  Filled 2019-09-27: qty 1

## 2019-09-27 MED ORDER — PROPOFOL 10 MG/ML IV BOLUS
INTRAVENOUS | Status: DC | PRN
  Administered 2019-09-27 (×2): 20 mg via INTRAVENOUS

## 2019-09-27 MED ORDER — PANTOPRAZOLE SODIUM 40 MG PO TBEC
40.0000 mg | DELAYED_RELEASE_TABLET | Freq: Every day | ORAL | Status: DC
  Administered 2019-09-28: 40 mg via ORAL
  Filled 2019-09-27: qty 1

## 2019-09-27 MED ORDER — SODIUM CHLORIDE (PF) 0.9 % IJ SOLN
INTRAMUSCULAR | Status: AC
  Filled 2019-09-27: qty 10

## 2019-09-27 MED ORDER — ACETAMINOPHEN 10 MG/ML IV SOLN
1000.0000 mg | Freq: Four times a day (QID) | INTRAVENOUS | Status: DC
  Administered 2019-09-27: 10:00:00 1000 mg via INTRAVENOUS

## 2019-09-27 MED ORDER — TRANEXAMIC ACID-NACL 1000-0.7 MG/100ML-% IV SOLN
INTRAVENOUS | Status: AC
  Filled 2019-09-27: qty 100

## 2019-09-27 MED ORDER — PROPOFOL 1000 MG/100ML IV EMUL
INTRAVENOUS | Status: AC
  Filled 2019-09-27: qty 100

## 2019-09-27 MED ORDER — HYDROMORPHONE HCL 1 MG/ML IJ SOLN
0.5000 mg | INTRAMUSCULAR | Status: DC | PRN
  Administered 2019-09-27: 0.5 mg via INTRAVENOUS
  Filled 2019-09-27: qty 0.5

## 2019-09-27 MED ORDER — STERILE WATER FOR IRRIGATION IR SOLN: Status: DC | PRN

## 2019-09-27 MED ORDER — METHOCARBAMOL 500 MG IVPB - SIMPLE MED
500.0000 mg | Freq: Four times a day (QID) | INTRAVENOUS | Status: DC | PRN
  Filled 2019-09-27: qty 50

## 2019-09-27 MED ORDER — ACETAMINOPHEN 10 MG/ML IV SOLN
INTRAVENOUS | Status: AC
  Filled 2019-09-27: qty 100

## 2019-09-27 MED ORDER — ACETAMINOPHEN 500 MG PO TABS
1000.0000 mg | ORAL_TABLET | Freq: Four times a day (QID) | ORAL | Status: DC
  Administered 2019-09-27 – 2019-09-28 (×3): 1000 mg via ORAL
  Filled 2019-09-27 (×3): qty 2

## 2019-09-27 MED ORDER — METHOCARBAMOL 500 MG PO TABS: 500.0000 mg | ORAL_TABLET | Freq: Four times a day (QID) | ORAL | 0 refills | Status: AC | PRN

## 2019-09-27 MED ORDER — DIPHENHYDRAMINE HCL 12.5 MG/5ML PO ELIX
12.5000 mg | ORAL_SOLUTION | ORAL | Status: DC | PRN
  Administered 2019-09-27: 25 mg via ORAL
  Filled 2019-09-27: qty 10

## 2019-09-27 MED ORDER — CELECOXIB 200 MG PO CAPS: 200.0000 mg | ORAL_CAPSULE | Freq: Once | ORAL | Status: AC

## 2019-09-27 MED ORDER — BUPIVACAINE LIPOSOME 1.3 % IJ SUSP
INTRAMUSCULAR | Status: DC | PRN
  Administered 2019-09-27: 20 mL

## 2019-09-27 MED ORDER — CEFAZOLIN SODIUM-DEXTROSE 2-4 GM/100ML-% IV SOLN
2.0000 g | Freq: Four times a day (QID) | INTRAVENOUS | Status: AC
  Administered 2019-09-27 (×2): 2 g via INTRAVENOUS
  Filled 2019-09-27 (×2): qty 100

## 2019-09-27 MED ORDER — SODIUM CHLORIDE 0.9 % IV SOLN: INTRAVENOUS | Status: DC

## 2019-09-27 MED ORDER — TRAMADOL HCL 50 MG PO TABS
50.0000 mg | ORAL_TABLET | Freq: Four times a day (QID) | ORAL | Status: DC | PRN
  Administered 2019-09-27: 100 mg via ORAL
  Administered 2019-09-28: 50 mg via ORAL
  Filled 2019-09-27: qty 2
  Filled 2019-09-27: qty 1

## 2019-09-27 MED ORDER — FENTANYL CITRATE (PF) 100 MCG/2ML IJ SOLN: 25.0000 ug | INTRAMUSCULAR | Status: DC | PRN

## 2019-09-27 MED ORDER — TRANEXAMIC ACID-NACL 1000-0.7 MG/100ML-% IV SOLN
1000.0000 mg | INTRAVENOUS | Status: AC
  Administered 2019-09-27: 10:00:00 1000 mg via INTRAVENOUS

## 2019-09-27 MED ORDER — ONDANSETRON HCL 4 MG PO TABS: 4.0000 mg | ORAL_TABLET | Freq: Four times a day (QID) | ORAL | Status: DC | PRN

## 2019-09-27 MED ORDER — PROMETHAZINE HCL 25 MG/ML IJ SOLN: 6.2500 mg | INTRAMUSCULAR | Status: DC | PRN

## 2019-09-27 MED ORDER — SODIUM CHLORIDE (PF) 0.9 % IJ SOLN
INTRAMUSCULAR | Status: DC | PRN
  Administered 2019-09-27: 60 mL

## 2019-09-27 MED ORDER — ONDANSETRON HCL 4 MG/2ML IJ SOLN: 4.0000 mg | Freq: Four times a day (QID) | INTRAMUSCULAR | Status: DC | PRN

## 2019-09-27 MED ORDER — POLYETHYLENE GLYCOL 3350 17 G PO PACK: 17.0000 g | PACK | Freq: Every day | ORAL | Status: DC | PRN

## 2019-09-27 MED ORDER — ROPIVACAINE HCL 7.5 MG/ML IJ SOLN
INTRAMUSCULAR | Status: DC | PRN
  Administered 2019-09-27: 20 mL via PERINEURAL

## 2019-09-27 MED ORDER — DEXAMETHASONE SODIUM PHOSPHATE 10 MG/ML IJ SOLN
8.0000 mg | Freq: Once | INTRAMUSCULAR | Status: AC
  Administered 2019-09-27: 10:00:00 8 mg via INTRAVENOUS

## 2019-09-27 MED ORDER — TRAMADOL HCL 50 MG PO TABS: 50.0000 mg | ORAL_TABLET | Freq: Four times a day (QID) | ORAL | 0 refills | Status: AC | PRN

## 2019-09-27 MED ORDER — HYDROMORPHONE HCL 2 MG PO TABS
2.0000 mg | ORAL_TABLET | ORAL | Status: DC | PRN
  Administered 2019-09-27 – 2019-09-28 (×2): 2 mg via ORAL
  Administered 2019-09-28: 4 mg via ORAL
  Filled 2019-09-27 (×2): qty 1
  Filled 2019-09-27: qty 2

## 2019-09-27 MED ORDER — MIDAZOLAM HCL 2 MG/2ML IJ SOLN: 1.0000 mg | INTRAMUSCULAR | Status: DC

## 2019-09-27 MED ORDER — CELECOXIB 200 MG PO CAPS
ORAL_CAPSULE | ORAL | Status: AC
  Administered 2019-09-27: 08:00:00 200 mg via ORAL
  Filled 2019-09-27: qty 1

## 2019-09-27 MED ORDER — ASPIRIN EC 325 MG PO TBEC
325.0000 mg | DELAYED_RELEASE_TABLET | Freq: Two times a day (BID) | ORAL | 0 refills | Status: AC
Start: 2019-09-27 — End: 2019-10-18

## 2019-09-27 MED ORDER — FENTANYL CITRATE (PF) 100 MCG/2ML IJ SOLN
INTRAMUSCULAR | Status: AC
  Administered 2019-09-27: 100 ug via INTRAVENOUS
  Filled 2019-09-27: qty 2

## 2019-09-27 SURGICAL SUPPLY — 56 items
BAG DECANTER FOR FLEXI CONT (MISCELLANEOUS) IMPLANT
BAG ZIPLOCK 12X15 (MISCELLANEOUS) IMPLANT
BLADE SAG 18X100X1.27 (BLADE) ×3 IMPLANT
BLADE SAW SGTL 11.0X1.19X90.0M (BLADE) IMPLANT
BLADE SURG SZ10 CARB STEEL (BLADE) ×6 IMPLANT
BNDG ELASTIC 6X5.8 VLCR STR LF (GAUZE/BANDAGES/DRESSINGS) ×3 IMPLANT
CLOSURE WOUND 1/2 X4 (GAUZE/BANDAGES/DRESSINGS) ×2
CLOTH BEACON ORANGE TIMEOUT ST (SAFETY) ×3 IMPLANT
COVER SURGICAL LIGHT HANDLE (MISCELLANEOUS) ×3 IMPLANT
COVER WAND RF STERILE (DRAPES) ×3 IMPLANT
CUFF TOURN SGL QUICK 34 (TOURNIQUET CUFF) ×2
CUFF TRNQT CYL 34X4.125X (TOURNIQUET CUFF) ×1 IMPLANT
DECANTER SPIKE VIAL GLASS SM (MISCELLANEOUS) ×6 IMPLANT
DRAPE U-SHAPE 47X51 STRL (DRAPES) ×3 IMPLANT
DRSG ADAPTIC 3X8 NADH LF (GAUZE/BANDAGES/DRESSINGS) ×3 IMPLANT
DRSG AQUACEL AG ADV 3.5X10 (GAUZE/BANDAGES/DRESSINGS) ×3 IMPLANT
DRSG PAD ABDOMINAL 8X10 ST (GAUZE/BANDAGES/DRESSINGS) ×3 IMPLANT
DURAPREP 26ML APPLICATOR (WOUND CARE) ×3 IMPLANT
ELECT REM PT RETURN 15FT ADLT (MISCELLANEOUS) ×3 IMPLANT
EVACUATOR 1/8 PVC DRAIN (DRAIN) ×3 IMPLANT
GAUZE SPONGE 4X4 12PLY STRL (GAUZE/BANDAGES/DRESSINGS) ×3 IMPLANT
GLOVE BIO SURGEON STRL SZ7 (GLOVE) ×3 IMPLANT
GLOVE BIO SURGEON STRL SZ8 (GLOVE) ×3 IMPLANT
GLOVE BIOGEL PI IND STRL 7.0 (GLOVE) ×1 IMPLANT
GLOVE BIOGEL PI IND STRL 8 (GLOVE) ×1 IMPLANT
GLOVE BIOGEL PI INDICATOR 7.0 (GLOVE) ×2
GLOVE BIOGEL PI INDICATOR 8 (GLOVE) ×2
GOWN STRL REUS W/TWL LRG LVL3 (GOWN DISPOSABLE) ×12 IMPLANT
HANDPIECE INTERPULSE COAX TIP (DISPOSABLE) ×2
HOLDER FOLEY CATH W/STRAP (MISCELLANEOUS) IMPLANT
IMMOBILIZER KNEE 20 (SOFTGOODS) ×3
IMMOBILIZER KNEE 20 THIGH 36 (SOFTGOODS) ×1 IMPLANT
INSERT TIB ATTUNE RP SZ5X14 (Insert) ×3 IMPLANT
KIT TURNOVER KIT A (KITS) ×3 IMPLANT
MANIFOLD NEPTUNE II (INSTRUMENTS) ×3 IMPLANT
NS IRRIG 1000ML POUR BTL (IV SOLUTION) ×3 IMPLANT
PACK TOTAL KNEE CUSTOM (KITS) ×3 IMPLANT
PADDING CAST COTTON 6X4 STRL (CAST SUPPLIES) ×6 IMPLANT
PENCIL SMOKE EVACUATOR (MISCELLANEOUS) ×3 IMPLANT
PROTECTOR NERVE ULNAR (MISCELLANEOUS) ×3 IMPLANT
SET HNDPC FAN SPRY TIP SCT (DISPOSABLE) ×1 IMPLANT
STRIP CLOSURE SKIN 1/2X4 (GAUZE/BANDAGES/DRESSINGS) ×4 IMPLANT
SUT MNCRL AB 4-0 PS2 18 (SUTURE) ×3 IMPLANT
SUT STRATAFIX 0 PDS 27 VIOLET (SUTURE) ×3
SUT VIC AB 2-0 CT1 27 (SUTURE) ×6
SUT VIC AB 2-0 CT1 TAPERPNT 27 (SUTURE) ×3 IMPLANT
SUTURE STRATFX 0 PDS 27 VIOLET (SUTURE) ×1 IMPLANT
SWAB COLLECTION DEVICE MRSA (MISCELLANEOUS) IMPLANT
SWAB CULTURE ESWAB REG 1ML (MISCELLANEOUS) IMPLANT
SYR 50ML LL SCALE MARK (SYRINGE) ×6 IMPLANT
TOWER CARTRIDGE SMART MIX (DISPOSABLE) IMPLANT
TRAY CATH 16FR W/PLASTIC CATH (SET/KITS/TRAYS/PACK) ×3 IMPLANT
TRAY FOLEY MTR SLVR 16FR STAT (SET/KITS/TRAYS/PACK) IMPLANT
TUBE KAMVAC SUCTION (TUBING) IMPLANT
WATER STERILE IRR 1000ML POUR (IV SOLUTION) IMPLANT
WRAP KNEE MAXI GEL POST OP (GAUZE/BANDAGES/DRESSINGS) ×3 IMPLANT

## 2019-09-27 NOTE — Evaluation (Signed)
Physical Therapy Evaluation Patient Details Name: Joanna Barber MRN: [id number] DOB: 07-13-1965 Today's Date: 09/27/2019   History of Present Illness  54 yo female s/p L TK rev 5/12. 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  Clinical Impression  On eval POD 0, pt was Min guard-Min assist for mobility. She walked ~135 feet with a RW. Pain rated 8/10. Anticipate pt will progress well. Plan is for possible d/c home on tomorrow as long as all continues to go well.     Follow Up Recommendations Follow surgeon's recommendation for DC plan and follow-up therapies    Equipment Recommendations  3in1 (PT)    Recommendations for Other Services       Precautions / Restrictions Precautions Precautions: Fall;Knee Required Braces or Orthoses: Knee Immobilizer - Left Knee Immobilizer - Left: Discontinue once straight leg raise with < 10 degree lag Restrictions Weight Bearing Restrictions: No Other Position/Activity Restrictions: WBAT      Mobility  Bed Mobility Overal bed mobility: Needs Assistance Bed Mobility: Supine to Sit     Supine to sit: Min assist;HOB elevated     General bed mobility comments: Assist for L LE.  Transfers Overall transfer level: Needs assistance   Transfers: Sit to/from Stand Sit to Stand: Min assist         General transfer comment: Assist to rise, steady. VC safety, hand/LE placement.  Ambulation/Gait Ambulation/Gait assistance: Min guard Gait Distance (Feet): 135 Feet Assistive device: Rolling walker (2 wheeled) Gait Pattern/deviations: Step-to pattern;Step-through pattern;Decreased stride length     General Gait Details: Min guard for safety. VCs safety, sequence. Pt progressed to step through pattern as distance increased.  Stairs            Wheelchair Mobility    Modified Rankin (Stroke Patients Only)       Balance Overall balance assessment: Mild deficits observed, not formally tested                                            Pertinent Vitals/Pain Pain Assessment: 0-10 Pain Score: 8  Pain Location: L knee Pain Descriptors / Indicators: Sore;Aching Pain Intervention(s): Limited activity within patient's tolerance;Monitored during session;Ice applied;Repositioned    Home Living Family/patient expects to be discharged to:: Private residence Living Arrangements: Spouse/significant other Available Help at Discharge: Family Type of Home: House Home Access: Stairs to enter Entrance Stairs-Rails: Right Entrance Stairs-Number of Steps: 6 Home Layout: Able to live on main level with bedroom/bathroom Home Equipment: Walker - 2 wheels;Shower seat      Prior Function Level of Independence: Independent               Hand Dominance        Extremity/Trunk Assessment   Upper Extremity Assessment Upper Extremity Assessment: Overall WFL for tasks assessed    Lower Extremity Assessment Lower Extremity Assessment: Generalized weakness    Cervical / Trunk Assessment Cervical / Trunk Assessment: Normal  Communication   Communication: No difficulties  Cognition Arousal/Alertness: Awake/alert Behavior During Therapy: WFL for tasks assessed/performed Overall Cognitive Status: Within Functional Limits for tasks assessed                                        General Comments      Exercises     Assessment/Plan  PT Assessment Patient needs continued PT services  PT Problem List Decreased strength;Decreased range of motion;Decreased mobility;Decreased activity tolerance;Decreased balance;Decreased knowledge of use of DME;Pain       PT Treatment Interventions DME instruction;Gait training;Therapeutic activities;Therapeutic exercise;Patient/family education;Balance training;Functional mobility training    PT Goals (Current goals can be found in the Care Plan section)  Acute Rehab PT Goals Patient Stated Goal: get L knee to be as good as R PT Goal Formulation: With  patient/family Time For Goal Achievement: 10/11/19 Potential to Achieve Goals: Good    Frequency 7X/week   Barriers to discharge        Co-evaluation               AM-PAC PT "6 Clicks" Mobility  Outcome Measure Help needed turning from your back to your side while in a flat bed without using bedrails?: A Little Help needed moving from lying on your back to sitting on the side of a flat bed without using bedrails?: A Little Help needed moving to and from a bed to a chair (including a wheelchair)?: A Little Help needed standing up from a chair using your arms (e.g., wheelchair or bedside chair)?: A Little Help needed to walk in hospital room?: A Little Help needed climbing 3-5 steps with a railing? : A Little 6 Click Score: 18    End of Session Equipment Utilized During Treatment: Gait belt;Left knee immobilizer Activity Tolerance: Patient tolerated treatment well Patient left: in chair;with call bell/phone within reach;with family/visitor present   PT Visit Diagnosis: Other abnormalities of gait and mobility (R26.89);Pain Pain - Right/Left: Left Pain - part of body: Knee    Time: 9528-4132 PT Time Calculation (min) (ACUTE ONLY): 16 min   Charges:   PT Evaluation $PT Eval Low Complexity: 1 Low             Pinchas Reither P, PT Acute Rehabilitation

## 2019-09-27 NOTE — Brief Op Note (Signed)
09/27/2019  10:20 AM  PATIENT:  Bethzy Hauck  54 y.o. female  PRE-OPERATIVE DIAGNOSIS:  Unstable left total knee arthroplasty  POST-OPERATIVE DIAGNOSIS:  Unstable left total knee arthroplasty  PROCEDURE:  Procedure(s): LEFT KNEE POLYETHYLENE REVISION (Left)  SURGEON:  Surgeon(s) and Role:    Ollen Gross, MD - Primary  PHYSICIAN ASSISTANT:   ASSISTANTS: Arther Abbott, PA-C   ANESTHESIA:   Adductor canal block and spinal  EBL:25 ml  BLOOD ADMINISTERED:none  DRAINS: (Medium) Hemovact drain(s) in the left knee with  Suction Open   LOCAL MEDICATIONS USED:  OTHER Exparel  COUNTS:  YES  TOURNIQUET:   Total Tourniquet Time Documented: Thigh (Left) - 19 minutes Total: Thigh (Left) - 19 minutes   DICTATION: .Other Dictation: Dictation Number [id number]  PLAN OF CARE: Admit for overnight observation  PATIENT DISPOSITION:  PACU - hemodynamically stable.

## 2019-09-27 NOTE — Transfer of Care (Signed)
Immediate Anesthesia Transfer of Care Note  Patient: Joanna Barber  Procedure(s) Performed: LEFT KNEE POLYETHYLENE REVISION (Left Knee)  Patient Location: PACU  Anesthesia Type:Spinal  Level of Consciousness: awake, alert  and oriented  Airway & Oxygen Therapy: Patient Spontanous Breathing and Patient connected to face mask oxygen  Post-op Assessment: Report given to RN and Post -op Vital signs reviewed and stable  Post vital signs: Reviewed and stable  Last Vitals:  Vitals Value Taken Time  BP 120/79 09/27/19 1055  Temp    Pulse 72 09/27/19 1057  Resp 12 09/27/19 1057  SpO2 100 % 09/27/19 1057  Vitals shown include unvalidated device data.  Last Pain:  Vitals:   09/27/19 0826  TempSrc: Oral      Patients Stated Pain Goal: 4 (09/27/19 2524)  Complications: No apparent anesthesia complications

## 2019-09-27 NOTE — Anesthesia Preprocedure Evaluation (Addendum)
Anesthesia Evaluation  Patient identified by MRN, date of birth, ID band Patient awake    Reviewed: Allergy & Precautions, H&P , NPO status , Patient's Chart, lab work & pertinent test results  History of Anesthesia Complications Negative for: history of anesthetic complications  Airway Mallampati: II  TM Distance: >3 FB Neck ROM: Full    Dental no notable dental hx. (+) Dental Advisory Given   Pulmonary neg pulmonary ROS, former smoker,    Pulmonary exam normal        Cardiovascular Exercise Tolerance: Good hypertension, Pt. on medications Normal cardiovascular exam     Neuro/Psych negative neurological ROS  negative psych ROS   GI/Hepatic Neg liver ROS, GERD  ,  Endo/Other  negative endocrine ROS  Renal/GU negative Renal ROS  negative genitourinary   Musculoskeletal  (+) Arthritis , Osteoarthritis,    Abdominal   Peds  Hematology negative hematology ROS (+)   Anesthesia Other Findings   Reproductive/Obstetrics negative OB ROS                            Anesthesia Physical  Anesthesia Plan  ASA: II  Anesthesia Plan: Spinal   Post-op Pain Management:  Regional for Post-op pain   Induction:   PONV Risk Score and Plan: 2 and Propofol infusion, Ondansetron and Midazolam  Airway Management Planned: Simple Face Mask  Additional Equipment:   Intra-op Plan:   Post-operative Plan:   Informed Consent: I have reviewed the patients History and Physical, chart, labs and discussed the procedure including the risks, benefits and alternatives for the proposed anesthesia with the patient or authorized representative who has indicated his/her understanding and acceptance.     Dental advisory given  Plan Discussed with: Anesthesiologist, Surgeon and CRNA  Anesthesia Plan Comments:        Anesthesia Quick Evaluation

## 2019-09-27 NOTE — Discharge Instructions (Addendum)
 Joanna Aluisio, MD Total Joint Specialist [facility] [address] [place], [place] [address] [phone] POSTOPERATIVE DIRECTIONS  Knee Rehabilitation, Guidelines Following Surgery  Results after knee surgery are often greatly improved when you follow the exercise, range of motion and muscle strengthening exercises prescribed by your doctor. Safety measures are also important to protect the knee from further injury. If any of these exercises cause you to have increased pain or swelling in your knee joint, decrease the amount until you are comfortable again and slowly increase them. If you have problems or questions, call your caregiver or physical therapist for advice.   BLOOD CLOT PREVENTION . Take a 325 mg Aspirin two times a day for three weeks following surgery. Then take an 81 mg Aspirin once a day for three weeks. Then discontinue Aspirin. . You may resume your vitamins/supplements upon discharge from the hospital. . Do not take any NSAIDs (Advil, Aleve, Ibuprofen, Meloxicam, etc.) until you have discontinued the 325 mg Aspirin.  HOME CARE INSTRUCTIONS  . Remove items at home which could result in a fall. This includes throw rugs or furniture in walking pathways.  . ICE to the affected knee as much as tolerated. Icing helps control swelling. If the swelling is well controlled you will be more comfortable and rehab easier. Continue to use ice on the knee for pain and swelling from surgery. You may notice swelling that will progress down to the foot and ankle. This is normal after surgery. Elevate the leg when you are not up walking on it.    . Continue to use the breathing machine which will help keep your temperature down. It is common for your temperature to cycle up and down following surgery, especially at night when you are not up moving around and exerting yourself. The breathing machine keeps your lungs expanded and your temperature down. . Do not  place pillow under the operative knee, focus on keeping the knee straight while resting  DIET You may resume your previous home diet once you are discharged from the hospital.  DRESSING / WOUND CARE / SHOWERING . Keep your bulky bandage on for 2 days. On the third post-operative day you may remove the Ace bandage and gauze. There is a waterproof adhesive bandage on your skin which will stay in place until your first follow-up appointment. Once you remove this you will not need to place another bandage . You may begin showering 3 days following surgery, but do not submerge the incision under water.  ACTIVITY For the first 5 days, the key is rest and control of pain and swelling . Do your home exercises twice a day starting on post-operative day 3. On the days you go to physical therapy, just do the home exercises once that day. . You should rest, ice and elevate the leg for 50 minutes out of every hour. Get up and walk/stretch for 10 minutes per hour. After 5 days you can increase your activity slowly as tolerated. . Walk with your walker as instructed. Use the walker until you are comfortable transitioning to a cane. Walk with the cane in the opposite hand of the operative leg. You may discontinue the cane once you are comfortable and walking steadily. . Avoid periods of inactivity such as sitting longer than an hour when not asleep. This helps prevent blood clots.  . You may discontinue the knee immobilizer once you are able to perform a straight leg raise while lying down. .   You may resume a sexual relationship in one month or when given the OK by your doctor.  . You may return to work once you are cleared by your doctor.  . Do not drive a car for 6 weeks or until released by your surgeon.  . Do not drive while taking narcotics.  TED HOSE STOCKINGS Wear the elastic stockings on both legs for three weeks following surgery during the day. You may remove them at night for sleeping.  WEIGHT  BEARING Weight bearing as tolerated with assist device (walker, cane, etc) as directed, use it as long as suggested by your surgeon or therapist, typically at least 4-6 weeks.  POSTOPERATIVE CONSTIPATION PROTOCOL Constipation - defined medically as fewer than three stools per week and severe constipation as less than one stool per week.  One of the most common issues patients have following surgery is constipation.  Even if you have a regular bowel pattern at home, your normal regimen is likely to be disrupted due to multiple reasons following surgery.  Combination of anesthesia, postoperative narcotics, change in appetite and fluid intake all can affect your bowels.  In order to avoid complications following surgery, here are some recommendations in order to help you during your recovery period.  . Colace (docusate) - Pick up an over-the-counter form of Colace or another stool softener and take twice a day as long as you are requiring postoperative pain medications.  Take with a full glass of water daily.  If you experience loose stools or diarrhea, hold the colace until you stool forms back up. If your symptoms do not get better within 1 week or if they get worse, check with your doctor. . Dulcolax (bisacodyl) - Pick up over-the-counter and take as directed by the product packaging as needed to assist with the movement of your bowels.  Take with a full glass of water.  Use this product as needed if not relieved by Colace only.  . MiraLax (polyethylene glycol) - Pick up over-the-counter to have on hand. MiraLax is a solution that will increase the amount of water in your bowels to assist with bowel movements.  Take as directed and can mix with a glass of water, juice, soda, coffee, or tea. Take if you go more than two days without a movement. Do not use MiraLax more than once per day. Call your doctor if you are still constipated or irregular after using this medication for 7 days in a row.  If you  continue to have problems with postoperative constipation, please contact the office for further assistance and recommendations.  If you experience "the worst abdominal pain ever" or develop nausea or vomiting, please contact the office immediatly for further recommendations for treatment.  ITCHING If you experience itching with your medications, try taking only a single pain pill, or even half a pain pill at a time.  You can also use Benadryl over the counter for itching or also to help with sleep.   MEDICATIONS See your medication summary on the "After Visit Summary" that the nursing staff will review with you prior to discharge.  You may have some home medications which will be placed on hold until you complete the course of blood thinner medication.  It is important for you to complete the blood thinner medication as prescribed by your surgeon.  Continue your approved medications as instructed at time of discharge.  PRECAUTIONS . If you experience chest pain or shortness of breath - call 911 immediately for   transfer to the hospital emergency department.  . If you develop a fever greater that 101 F, purulent drainage from wound, increased redness or drainage from wound, foul odor from the wound/dressing, or calf pain - CONTACT YOUR SURGEON.                                                   FOLLOW-UP APPOINTMENTS Make sure you keep all of your appointments after your operation with your surgeon and caregivers. You should call the office at the above phone number and make an appointment for approximately two weeks after the date of your surgery or on the date instructed by your surgeon outlined in the "After Visit Summary".  RANGE OF MOTION AND STRENGTHENING EXERCISES  Rehabilitation of the knee is important following a knee injury or an operation. After just a few days of immobilization, the muscles of the thigh which control the knee become weakened and shrink (atrophy). Knee exercises are  designed to build up the tone and strength of the thigh muscles and to improve knee motion. Often times heat used for twenty to thirty minutes before working out will loosen up your tissues and help with improving the range of motion but do not use heat for the first two weeks following surgery. These exercises can be done on a training (exercise) mat, on the floor, on a table or on a bed. Use what ever works the best and is most comfortable for you Knee exercises include:  . Leg Lifts - While your knee is still immobilized in a splint or cast, you can do straight leg raises. Lift the leg to 60 degrees, hold for 3 sec, and slowly lower the leg. Repeat 10-20 times 2-3 times daily. Perform this exercise against resistance later as your knee gets better.  . Quad and Hamstring Sets - Tighten up the muscle on the front of the thigh (Quad) and hold for 5-10 sec. Repeat this 10-20 times hourly. Hamstring sets are done by pushing the foot backward against an object and holding for 5-10 sec. Repeat as with quad sets.   Leg Slides: Lying on your back, slowly slide your foot toward your buttocks, bending your knee up off the floor (only go as far as is comfortable). Then slowly slide your foot back down until your leg is flat on the floor again.  Angel Wings: Lying on your back spread your legs to the side as far apart as you can without causing discomfort.  A rehabilitation program following serious knee injuries can speed recovery and prevent re-injury in the future due to weakened muscles. Contact your doctor or a physical therapist for more information on knee rehabilitation.   IF YOU ARE TRANSFERRED TO A SKILLED REHAB FACILITY If the patient is transferred to a skilled rehab facility following release from the hospital, a list of the current medications will be sent to the facility for the patient to continue.  When discharged from the skilled rehab facility, please have the facility set up the patient's Home  Health Physical Therapy prior to being released. Also, the skilled facility will be responsible for providing the patient with their medications at time of release from the facility to include their pain medication, the muscle relaxants, and their blood thinner medication. If the patient is still at the rehab facility at time of the   two week follow up appointment, the skilled rehab facility will also need to assist the patient in arranging follow up appointment in our office and any transportation needs.  MAKE SURE YOU:  . Understand these instructions.  . Get help right away if you are not doing well or get worse.   DENTAL ANTIBIOTICS:  In most cases prophylactic antibiotics for Dental procdeures after total joint surgery are not necessary.  Exceptions are as follows:  1. History of prior total joint infection  2. Severely immunocompromised (Organ Transplant, cancer chemotherapy, Rheumatoid biologic meds such as Humera)  3. Poorly controlled diabetes (A1C &gt; 8.0, blood glucose over 200)  If you have one of these conditions, contact your surgeon for an antibiotic prescription, prior to your dental procedure.    Pick up stool softner and laxative for home use following surgery while on pain medications. Do not submerge incision under water. Please use good hand washing techniques while changing dressing each day. May shower starting three days after surgery. Please use a clean towel to pat the incision dry following showers. Continue to use ice for pain and swelling after surgery. Do not use any lotions or creams on the incision until instructed by your surgeon.  

## 2019-09-27 NOTE — Anesthesia Procedure Notes (Signed)
Procedure Name: MAC Date/Time: 09/27/2019 9:34 AM Performed by: Eben Burow, CRNA Pre-anesthesia Checklist: Patient identified, Emergency Drugs available, Suction available, Patient being monitored and Timeout performed Oxygen Delivery Method: Simple face mask Dental Injury: Teeth and Oropharynx as per pre-operative assessment

## 2019-09-27 NOTE — Interval H&P Note (Signed)
History and Physical Interval Note:  09/27/2019 7:49 AM  Joanna Barber  has presented today for surgery, with the diagnosis of Unstable left total knee arthroplasty.  The various methods of treatment have been discussed with the patient and family. After consideration of risks, benefits and other options for treatment, the patient has consented to  Procedure(s) with comments: Left knee polyethylene revision (Left) - as a surgical intervention.  The patient's history has been reviewed, patient examined, no change in status, stable for surgery.  I have reviewed the patient's chart and labs.  Questions were answered to the patient's satisfaction.     Homero Fellers Jelisa Kaaawa

## 2019-09-27 NOTE — Anesthesia Postprocedure Evaluation (Signed)
Anesthesia Post Note  Patient: Joanna Barber  Procedure(s) Performed: LEFT KNEE POLYETHYLENE REVISION (Left Knee)     Patient location during evaluation: PACU Anesthesia Type: Spinal Level of consciousness: oriented and awake and alert Pain management: pain level controlled Vital Signs Assessment: post-procedure vital signs reviewed and stable Respiratory status: spontaneous breathing, respiratory function stable and patient connected to nasal cannula oxygen Cardiovascular status: blood pressure returned to baseline and stable Postop Assessment: no headache, no backache and no apparent nausea or vomiting Anesthetic complications: no    Last Vitals:  Vitals:   09/27/19 1300 09/27/19 1315  BP: 132/81 (!) 130/92  Pulse: 68 73  Resp: 13 14  Temp:    SpO2: 99% 98%    Last Pain:  Vitals:   09/27/19 1300  TempSrc:   PainSc: Asleep                 Taquisha Phung DANIEL

## 2019-09-27 NOTE — Anesthesia Procedure Notes (Signed)
Spinal  Patient location during procedure: OR Start time: 09/27/2019 9:39 AM Staffing Performed: resident/CRNA  Anesthesiologist: Duane Boston, MD Resident/CRNA: Eben Burow, CRNA Preanesthetic Checklist Completed: patient identified, IV checked, site marked, risks and benefits discussed, surgical consent, monitors and equipment checked, pre-op evaluation and timeout performed Spinal Block Patient position: sitting Prep: DuraPrep and site prepped and draped Patient monitoring: continuous pulse ox, blood pressure, cardiac monitor and heart rate Approach: midline Location: L3-4 Injection technique: single-shot Needle Needle type: Pencan  Needle gauge: 24 G Needle length: 9 cm Assessment Sensory level: T4 Additional Notes Pt placed in sitting position, spinal kit expiration date checked and verified, + CSF, - heme, pt tolerated well. Dr Tobias Alexander present and supervising throughout [place].

## 2019-09-27 NOTE — Anesthesia Procedure Notes (Signed)
Anesthesia Regional Block: Adductor canal block   Pre-Anesthetic Checklist: ,, timeout performed, Correct Patient, Correct Site, Correct Laterality, Correct Procedure, Correct Position, site marked, Risks and benefits discussed,  Surgical consent,  Pre-op evaluation,  At surgeon's request and post-op pain management  Laterality: Left  Prep: chloraprep       Needles:  Injection technique: Single-shot  Needle Type: Stimulator Needle - 80     Needle Length: 10cm  Needle Gauge: 21     Additional Needles:   Narrative:  Start time: 09/27/2019 8:53 AM End time: 09/27/2019 9:03 AM Injection made incrementally with aspirations every 5 mL.  Performed by: Personally

## 2019-09-27 NOTE — Progress Notes (Signed)
Assisted Dr. Singer with right, ultrasound guided, adductor canal block. Side rails up, monitors on throughout procedure. See vital signs in flow sheet. Tolerated Procedure well.  

## 2019-09-27 NOTE — Op Note (Signed)
NAME: Joanna Barber, [place] [facility] MEDICAL RECORD DZ:[id number] ACCOUNT [id number] DATE OF BIRTH:[birth year] FACILITY: [facility] LOCATION: [facility] PHYSICIAN:Awa Bachicha Dulcy Fanny, MD  OPERATIVE REPORT  DATE OF PROCEDURE:  09/27/2019  PREOPERATIVE DIAGNOSIS:  Unstable left total knee arthroplasty.  POSTOPERATIVE DIAGNOSIS:  Unstable left total knee arthroplasty.  PROCEDURE:  Left knee polyethylene revision.  SURGEON:  Ollen Gross, MD  ASSISTANT:  Arther Abbott, PA-C  ANESTHESIA:  Adductor canal block and spinal.  ESTIMATED BLOOD LOSS:  Minimal.  DRAIN:  Hemovac x1.  TOURNIQUET TIME:  19 minutes at 300 mmHg.  COMPLICATIONS:  None.  CONDITION:  Stable to recovery.  BRIEF CLINICAL NOTE:  Joanna Barber is a 54 year old female who had bilateral total knee arthroplasty done last year.  She was doing very well initially, but then started to develop instability and pain in the left knee.  She did not respond to physical therapy  and has had worsening mechanical issues and discomfort with the knee.  Her x-rays show excellent position and alignment of the components.  It was felt that she has most likely developed some ligament laxity and it should be corrected with a thicker  polyethylene.  She presents today for polyethylene versus total knee arthroplasty revision of the left knee.  PROCEDURE IN DETAIL:  After successful administration of adductor canal block and spinal anesthetic, a tourniquet was placed high on the left thigh and left lower extremity prepped and draped in the usual sterile fashion.  Extremity was wrapped in  Esmarch and tourniquet inflated to 300 mmHg.  Midline incision was made with a 10 blade through subcutaneous tissue to the level of the extensor mechanism.  A fresh blade was used to make a medial parapatellar arthrotomy.  A small amount of fluid was  present in the joint.  The fluid was clear.  The soft tissue over the proximal medial tibia subperiosteally elevated to the joint line  with a knife and into the semimembranosus bursa with a Cobb elevator.  Soft tissue laterally was elevated with  attention being paid to avoid the patellar tendon on the tibial tubercle.  Patella was everted, knee flexed 90 degrees.  She had significant AP laxity in flexion and significant varus valgus laxity in extension.  I was able to remove the tibial  polyethylene which was a 10 mm thick rotating platform posterior stabilized polyethylene for a size 5 femur.  I did a thorough synovectomy and then we placed a trial 14 mm insert.  With the 14 full extension was achieved with excellent varus/valgus and  anterior/posterior balance throughout full range of motion.  Metal components were inspected and are in excellent position and are well fixed.  The permanent 14 mm posterior stabilized rotating platform polyethylene for the size 5 femur was then placed  into the tibial tray.  The knee was reduced with the same stability parameters.  The wound was copiously irrigated with saline solution and then 20 mL of Exparel mixed with 60 mL of saline was injected into the posterior capsule, medial retinaculum,  periosteum of the femur, and subcutaneous tissues.  The wound was again irrigated and the arthrotomy closed over a Hemovac drain with a running StrataFix suture.  Flexion against gravity was 135 degrees.  Patella tracks normally.  Stability is great  throughout the whole range of motion.  Tourniquet was released.  Total time of 19 minutes.  Subcutaneous was closed with interrupted 2-0 Vicryl and subcuticular running 4-0 Monocryl.  The incision was then cleaned and dried and Steri-Strips and a  bulky  sterile dressing applied.  She was then awakened and transported to recovery in stable condition.  CN/NUANCE  D:09/27/2019 T:09/27/2019 JOB:[id number]

## 2019-09-28 ENCOUNTER — Encounter: Payer: Self-pay | Admitting: *Deleted

## 2019-09-28 DIAGNOSIS — T84023A Instability of internal left knee prosthesis, initial encounter: Secondary | ICD-10-CM | POA: Diagnosis not present

## 2019-09-28 LAB — CBC
HCT: 34.6 % — ABNORMAL LOW (ref 36.0–46.0)
Hemoglobin: 11 g/dL — ABNORMAL LOW (ref 12.0–15.0)
MCH: 31.5 pg (ref 26.0–34.0)
MCHC: 31.8 g/dL (ref 30.0–36.0)
MCV: 99.1 fL (ref 80.0–100.0)
Platelets: 283 10*3/uL (ref 150–400)
RBC: 3.49 MIL/uL — ABNORMAL LOW (ref 3.87–5.11)
RDW: 12 % (ref 11.5–15.5)
WBC: 15.2 10*3/uL — ABNORMAL HIGH (ref 4.0–10.5)
nRBC: 0 % (ref 0.0–0.2)

## 2019-09-28 LAB — BASIC METABOLIC PANEL
Anion gap: 9 (ref 5–15)
BUN: 13 mg/dL (ref 6–20)
CO2: 26 mmol/L (ref 22–32)
Calcium: 8.7 mg/dL — ABNORMAL LOW (ref 8.9–10.3)
Chloride: 104 mmol/L (ref 98–111)
Creatinine, Ser: 0.68 mg/dL (ref 0.44–1.00)
GFR calc Af Amer: >60 mL/min (ref >60)
GFR calc non Af Amer: >60 mL/min (ref >60)
Glucose, Bld: 146 mg/dL — ABNORMAL HIGH (ref 70–99)
Potassium: 4.1 mmol/L (ref 3.5–5.1)
Sodium: 139 mmol/L (ref 135–145)

## 2019-09-28 NOTE — Progress Notes (Signed)
   Subjective: 1 Day Post-Op Procedure(s) (LRB): LEFT KNEE POLYETHYLENE REVISION (Left) Patient reports pain as mild.   Patient seen in rounds for Dr. Lequita Halt. Patient is well, and has had no acute complaints or problems. Did not sleep well last night, IV came out accidentally. Feeling better this AM, states she is ready to go home. Denies chest pain, SOB, or calf pain. We will continue therapy today, ambulated 135' yesterday.   Objective: Vital signs in last 24 hours: Temp:  [97.5 F (36.4 C)-98.8 F (37.1 C)] 98 F (36.7 C) (05/13 0544) Pulse Rate:  [63-88] 79 (05/13 0544) Resp:  [7-22] 19 (05/13 0544) BP: (130-174)/(71-112) 137/83 (05/13 0544) SpO2:  [95 %-100 %] 99 % (05/13 0544) Weight:  [86.3 kg] 86.3 kg (05/12 0808)  Intake/Output from previous day:  Intake/Output Summary (Last 24 hours) at 09/28/2019 0752 Last data filed at 09/28/2019 0527 Gross per 24 hour  Intake 3240.01 ml  Output 2252 ml  Net 988.01 ml     Intake/Output this shift: No intake/output data recorded.  Labs: Recent Labs    09/28/19 0320  HGB 11.0*   Recent Labs    09/28/19 0320  WBC 15.2*  RBC 3.49*  HCT 34.6*  PLT 283   Recent Labs    09/28/19 0320  NA 139  K 4.1  CL 104  CO2 26  BUN 13  CREATININE 0.68  GLUCOSE 146*  CALCIUM 8.7*   No results for input(s): LABPT, INR in the last 72 hours.  Exam: General - Patient is Alert and Oriented Extremity - Neurologically intact Neurovascular intact Sensation intact distally Dorsiflexion/Plantar flexion intact Dressing - dressing C/D/I Motor Function - intact, moving foot and toes well on exam.   Past Medical History:  Diagnosis Date  . Arthritis    bi lat knees  . GERD (gastroesophageal reflux disease)   . Hypertension     Assessment/Plan: 1 Day Post-Op Procedure(s) (LRB): LEFT KNEE POLYETHYLENE REVISION (Left) Principal Problem:   Failed total knee, left (HCC) Active Problems:   Failed total knee arthroplasty  (HCC)  Estimated body mass index is 31.41 kg/m as calculated from the following:   Height as of this encounter: 5' 5.25" (1.657 m).   Weight as of this encounter: 86.3 kg. Advance diet Up with therapy  DVT Prophylaxis - Aspirin Weight bearing as tolerated. D/C O2 and pulse ox and try on room air. Hemovac pulled without difficulty, will continue therapy.  Plan is to go Home after hospital stay. Plan for discharge around lunchtime after one session of PT. Scheduled for OPPT at the [facility] in [facility] beginning May 18th. Follow-up in the office on May 25.  Arther Abbott, PA-C Orthopedic Surgery [phone] 09/28/2019, 7:52 AM

## 2019-09-28 NOTE — Progress Notes (Signed)
Physical Therapy Treatment Patient Details Name: Joanna Barber MRN: [id number] DOB: [birth year] Today's Date: 09/28/2019    History of Present Illness 54 yo female s/p L TK rev 5/12. 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    PT Comments    Pt has met PT goals and is ready to DC home from PT standpoint. She ambulated 350', initially with RW then progressed to [facility] and then to no assistive device. Stair training completed. Pt demonstrates good understanding of HEP.    Follow Up Recommendations  Follow surgeon's recommendation for DC plan and follow-up therapies     Equipment Recommendations  None recommended by PT    Recommendations for Other Services       Precautions / Restrictions Precautions Precautions: Fall;Knee Required Braces or Orthoses: Knee Immobilizer - Left Knee Immobilizer - Left: Discontinue once straight leg raise with < 10 degree lag Restrictions Weight Bearing Restrictions: No Other Position/Activity Restrictions: WBAT    Mobility  Bed Mobility               General bed mobility comments: up in recliner  Transfers Overall transfer level: Needs assistance Equipment used: Rolling walker (2 wheeled) Transfers: Sit to/from Stand Sit to Stand: Modified independent (Device/Increase time)         General transfer comment: used armrests to push up  Ambulation/Gait Ambulation/Gait assistance: Modified independent (Device/Increase time);Independent Gait Distance (Feet): 350 Feet Assistive device: Rolling walker (2 wheeled);Straight cane;None Gait Pattern/deviations: Decreased stride length;Step-through pattern Gait velocity: WNL   General Gait Details: started with RW, progressed to [facility], then to no AD; no LOB without AD   Stairs Stairs: Yes Stairs assistance: Supervision Stair Management: One rail Left Number of Stairs: 3 General stair comments: VCs sequencing   Wheelchair Mobility    Modified Rankin (Stroke Patients Only)       Balance Overall  balance assessment: Modified Independent                                          Cognition Arousal/Alertness: Awake/alert Behavior During Therapy: WFL for tasks assessed/performed Overall Cognitive Status: Within Functional Limits for tasks assessed                                        Exercises Total Joint Exercises Ankle Circles/Pumps: AROM;Both;10 reps;Supine Quad Sets: AROM;Both;5 reps;Supine Towel Squeeze: AROM;Both;5 reps;Supine Short Arc Quad: AROM;Left;10 reps;Supine Heel Slides: AAROM;Left;10 reps;Supine Hip ABduction/ADduction: AROM;Left;10 reps;Supine Straight Leg Raises: AROM;Left;5 reps;Supine Long Arc Quad: AROM;Left;10 reps;Seated Knee Flexion: AAROM;Left;10 reps;Seated Goniometric ROM: 5-100* AAROM L knee    General Comments        Pertinent Vitals/Pain Pain Score: 5  Pain Location: L knee Pain Descriptors / Indicators: Sore;Aching Pain Intervention(s): Limited activity within patient's tolerance;Monitored during session;Premedicated before session;Ice applied    Home Living                      Prior Function            PT Goals (current goals can now be found in the care plan section) Acute Rehab PT Goals Patient Stated Goal: get L knee to be as good as R; play pickleball PT Goal Formulation: With patient Time For Goal Achievement: 10/11/19 Potential to Achieve Goals: Good Progress towards PT goals: Goals  met/education completed, patient discharged from PT    Frequency    7X/week      PT Plan Current plan remains appropriate    Co-evaluation              AM-PAC PT "6 Clicks" Mobility   Outcome Measure  Help needed turning from your back to your side while in a flat bed without using bedrails?: None Help needed moving from lying on your back to sitting on the side of a flat bed without using bedrails?: None Help needed moving to and from a bed to a chair (including a wheelchair)?:  None Help needed standing up from a chair using your arms (e.g., wheelchair or bedside chair)?: None Help needed to walk in hospital room?: None Help needed climbing 3-5 steps with a railing? : None 6 Click Score: 24    End of Session Equipment Utilized During Treatment: Gait belt;Left knee immobilizer Activity Tolerance: Patient tolerated treatment well Patient left: in chair;with call bell/phone within reach   PT Visit Diagnosis: Other abnormalities of gait and mobility (R26.89);Pain Pain - Right/Left: Left Pain - part of body: Knee     Time: 3142-7670 PT Time Calculation (min) (ACUTE ONLY): 25 min  Charges:  $Gait Training: 8-22 mins $Therapeutic Exercise: 8-22 mins                     Blondell Reveal Kistler PT 09/28/2019  Acute Rehabilitation Services Pager [phone] Office [phone]

## 2019-09-28 NOTE — Plan of Care (Signed)
Patient discharged home in stable condition 

## 2021-10-31 ENCOUNTER — Other Ambulatory Visit: Payer: Self-pay

## 2021-10-31 ENCOUNTER — Emergency Department (HOSPITAL_BASED_OUTPATIENT_CLINIC_OR_DEPARTMENT_OTHER)
Admission: EM | Admit: 2021-10-31 | Discharge: 2021-10-31 | Disposition: A | Discharge status: Home / Self Care | Payer: 59 | Attending: Emergency Medicine | Admitting: Emergency Medicine

## 2021-10-31 ENCOUNTER — Encounter (HOSPITAL_BASED_OUTPATIENT_CLINIC_OR_DEPARTMENT_OTHER): Payer: Self-pay

## 2021-10-31 DIAGNOSIS — I10 Essential (primary) hypertension: Secondary | ICD-10-CM | POA: Diagnosis not present

## 2021-10-31 DIAGNOSIS — K649 Unspecified hemorrhoids: Secondary | ICD-10-CM | POA: Diagnosis present

## 2021-10-31 DIAGNOSIS — Z79899 Other long term (current) drug therapy: Secondary | ICD-10-CM | POA: Insufficient documentation

## 2021-10-31 DIAGNOSIS — K644 Residual hemorrhoidal skin tags: Secondary | ICD-10-CM | POA: Insufficient documentation

## 2021-10-31 MED ORDER — LIDOCAINE VISCOUS HCL 2 % MT SOLN: OROMUCOSAL | 0 refills | Status: AC

## 2021-10-31 MED ORDER — OXYCODONE HCL 5 MG PO TABS: 5.0000 mg | ORAL_TABLET | ORAL | 0 refills | Status: AC | PRN

## 2021-10-31 NOTE — ED Triage Notes (Signed)
Patient has painful hemorrhoid and states any OTC medication is not helping.

## 2021-10-31 NOTE — ED Provider Notes (Signed)
[facility] EMERGENCY DEPARTMENT Provider Note   CSN: [id number] Arrival date & time: 10/31/21  1453    History  Chief Complaint  Patient presents with   Hemorrhoids    Joanna Barber is a 56 y.o. female hx of failed knee arthroplasty, hypertension here for evaluation of hemorrhoid.  Began 2 days ago.  Was seen by PCP yesterday.  Trying OTC medications without relief.  She has an appointment early next week with surgeon for evaluation.  Has some pain with bowel movement.  No fever, redness or warmth.  HPI     Home Medications Prior to Admission medications   Medication Sig Start Date End Date Taking? Authorizing Provider  lidocaine (XYLOCAINE) 2 % solution Use as needed for pain 10/31/21  Yes Ramyah Pankowski A, PA-C  oxyCODONE (ROXICODONE) 5 MG immediate release tablet Take 1 tablet (5 mg total) by mouth every 4 (four) hours as needed for severe pain. 10/31/21  Yes Jadamarie Butson A, PA-C  acetaminophen (TYLENOL) 500 MG tablet Take 1,000 mg by mouth every 6 (six) hours as needed for moderate pain.    [provider]  Black Cohosh 40 MG CAPS Take 40 mg by mouth daily.    [provider]  gabapentin (NEURONTIN) 300 MG capsule Take 1 capsule (300 mg total) by mouth 3 (three) times daily. Take a 300 mg capsule three times a day for two weeks following surgery.Then take a 300 mg capsule two times a day for two weeks. Then take a 300 mg capsule once a day for two weeks. Then discontinue. Patient taking differently: Take 300 mg by mouth 2 (two) times daily.  12/02/18   Edmisten, Kristie L, PA  lisinopril-hydrochlorothiazide (ZESTORETIC) 20-25 MG tablet Take 1 tablet by mouth daily.    [provider]  methocarbamol (ROBAXIN) 500 MG tablet Take 1 tablet (500 mg total) by mouth every 6 (six) hours as needed for muscle spasms. 09/27/19   Edmisten, Kristie L, PA  Omega-3 Fatty Acids (FISH OIL) 1000 MG CAPS Take 1,000 mg by mouth daily.    [provider]  omeprazole (PRILOSEC) 20 MG capsule Take 20 mg by mouth daily.    [provider]      Allergies    Neosporin [neomycin-bacitracin zn-polymyx] and Codeine    Review of Systems   Review of Systems  Constitutional: Negative.   HENT: Negative.    Respiratory: Negative.    Cardiovascular: Negative.   Gastrointestinal:  Positive for rectal pain. Negative for abdominal distention, abdominal pain, anal bleeding, blood in stool, constipation, diarrhea, nausea and vomiting.  Genitourinary: Negative.   Musculoskeletal: Negative.   Skin: Negative.   Neurological: Negative.   All other systems reviewed and are negative.   Physical Exam Updated Vital Signs BP 136/71   Pulse 70   Temp 98.2 F (36.8 C) (Oral)   Resp 16   Ht 5\' 2"  (1.575 m)   Wt 80.7 kg   LMP 11/14/2017 (Approximate)   SpO2 100%   BMI 32.56 kg/m  Physical Exam Vitals and nursing note reviewed. Exam conducted with a chaperone present.  Constitutional:      General: She is not in acute distress.    Appearance: She is well-developed. She is not ill-appearing, toxic-appearing or diaphoretic.  HENT:     Head: Atraumatic.  Eyes:     Pupils: Pupils are equal, round, and reactive to light.  Cardiovascular:     Rate and Rhythm: Normal rate.  Pulmonary:  Effort: No respiratory distress.  Abdominal:     General: There is no distension.  Genitourinary:    Rectum: Tenderness, external hemorrhoid and internal hemorrhoid present. No mass or anal fissure.     Comments: External thrombosed hemorrhoid at the 9 o clock position. Non thrombosed hemorrhoid at the 6 o'clock position Musculoskeletal:        General: Normal range of motion.     Cervical back: Normal range of motion.  Skin:    General: Skin is warm and dry.  Neurological:     General: No focal deficit present.     Mental Status: She is alert.  Psychiatric:        Mood and Affect: Mood normal.     ED Results / Procedures /  Treatments   Labs (all labs ordered are listed, but only abnormal results are displayed) Labs Reviewed - No data to display  EKG None  Radiology No results found.  Procedures Procedures    Medications Ordered in ED Medications - No data to display  ED Course/ Medical Decision Making/ A&P    56 year old here for evaluation of hemorrhoids.  Began 3 days ago.  Has appointment to follow-up with general surgeon in the next few days.  Using OTC meds without relief.  On exam patient has multiple hemorrhoids, some thrombosed, some non thrombosed.  She has no evidence of rectal or perirectal abscess.  Discussed medication for symptomatic management, MiraLAX, close follow-up with general surgery for definitive management.  She is agreeable.  The patient has been appropriately medically screened and/or stabilized in the ED. I have low suspicion for any other emergent medical condition which would require further screening, evaluation or treatment in the ED or require inpatient management.  Patient is hemodynamically stable and in no acute distress.  Patient able to ambulate in department prior to ED.  Evaluation does not show acute pathology that would require ongoing or additional emergent interventions while in the emergency department or further inpatient treatment.  I have discussed the diagnosis with the patient and answered all questions.  Pain is been managed while in the emergency department and patient has no further complaints prior to discharge.  Patient is comfortable with plan discussed in room and is stable for discharge at this time.  I have discussed strict return precautions for returning to the emergency department.  Patient was encouraged to follow-up with PCP/specialist refer to at discharge.                            Medical Decision Making Amount and/or Complexity of Data Reviewed Independent Historian: spouse  Risk OTC drugs. Prescription drug management. Diagnosis or  treatment significantly limited by social determinants of health.          Final Clinical Impression(s) / ED Diagnoses Final diagnoses:  Hemorrhoids, unspecified hemorrhoid type    Rx / DC Orders ED Discharge Orders          Ordered    oxyCODONE (ROXICODONE) 5 MG immediate release tablet  Every 4 hours PRN        10/31/21 1838    lidocaine (XYLOCAINE) 2 % solution        10/31/21 1838              Shannia Jacuinde A, PA-C 10/31/21 1958    Rolan Bucco, MD 10/31/21 2003

## 2021-10-31 NOTE — Discharge Instructions (Signed)
Follow up with your surgeon
